# Patient Record
Sex: Female | Born: 1970 | Hispanic: Yes | Marital: Married | Smoking: Former smoker
Health system: Southern US, Community
[De-identification: ages and names within clinical notes are randomized; demographics above are authoritative.]

## PROBLEM LIST (undated history)

## (undated) DIAGNOSIS — J342 Deviated nasal septum: Principal | ICD-10-CM

## (undated) DIAGNOSIS — J343 Hypertrophy of nasal turbinates: Secondary | ICD-10-CM

## (undated) DIAGNOSIS — Z98811 Dental restoration status: Secondary | ICD-10-CM

## (undated) DIAGNOSIS — A159 Respiratory tuberculosis unspecified: Secondary | ICD-10-CM

## (undated) DIAGNOSIS — G43909 Migraine, unspecified, not intractable, without status migrainosus: Secondary | ICD-10-CM

## (undated) HISTORY — PX: TONSILLECTOMY: SUR1361

---

## 1996-05-07 HISTORY — PX: DIAGNOSTIC LAPAROSCOPY: SUR761

## 2011-12-05 ENCOUNTER — Encounter (HOSPITAL_COMMUNITY): Payer: Self-pay | Admitting: Pharmacist

## 2011-12-06 ENCOUNTER — Encounter (HOSPITAL_COMMUNITY): Payer: Self-pay

## 2011-12-06 ENCOUNTER — Encounter (HOSPITAL_COMMUNITY)
Admission: RE | Admit: 2011-12-06 | Discharge: 2011-12-06 | Disposition: A | Discharge status: Home / Self Care | Payer: BC Managed Care – PPO | Source: Ambulatory Visit | Attending: Obstetrics & Gynecology | Admitting: Obstetrics & Gynecology

## 2011-12-06 LAB — CBC
MCH: 29.6 pg (ref 26.0–34.0)
MCHC: 32.8 g/dL (ref 30.0–36.0)
MCV: 90.3 fL (ref 78.0–100.0)
Platelets: 180 10*3/uL (ref 150–400)
RDW: 12.4 % (ref 11.5–15.5)

## 2011-12-06 NOTE — Patient Instructions (Addendum)
   Your procedure is scheduled on: Thursday August 8th  Enter through the Main Entrance of [facility] at: 11:30am Pick up the phone at the desk and dial [phone] and inform us of your arrival.  Please call this number if you have any problems the morning of surgery: [phone]  Remember: Do not eat food after midnight: Wednesday Do not drink clear liquids after: Thursday at 9am Take these medicines the morning of surgery with a SIP OF WATER: none  Do not wear jewelry, make-up, or FINGER nail polish No metal in your hair or on your body. Do not wear lotions, powders, perfumes or deodorant. Do not shave 48 hours prior to surgery. Do not bring valuables to the hospital. Contacts, dentures or bridgework may not be worn into surgery.  Leave suitcase in the car. After Surgery it may be brought to your room. For patients being admitted to the hospital, checkout time is 11:00am the day of discharge.  Patients discharged on the day of surgery will not be allowed to drive home.     Remember to use your hibiclens as instructed.Please shower with 1/2 bottle the evening before your surgery and the other 1/2 bottle the morning of surgery. Neck down avoiding private area.

## 2011-12-10 ENCOUNTER — Other Ambulatory Visit: Payer: Self-pay | Admitting: Obstetrics & Gynecology

## 2011-12-12 MED ORDER — DEXTROSE 5 % IV SOLN
2.0000 g | INTRAVENOUS | Status: AC
  Administered 2011-12-13: 2 g via INTRAVENOUS
  Filled 2011-12-12: qty 2

## 2011-12-13 ENCOUNTER — Encounter (HOSPITAL_COMMUNITY)
Admission: RE | Disposition: A | Discharge status: Home / Self Care | Payer: Self-pay | Source: Ambulatory Visit | Attending: Obstetrics & Gynecology

## 2011-12-13 ENCOUNTER — Encounter (HOSPITAL_COMMUNITY): Payer: Self-pay | Admitting: Anesthesiology

## 2011-12-13 ENCOUNTER — Encounter (HOSPITAL_COMMUNITY): Payer: Self-pay | Admitting: *Deleted

## 2011-12-13 ENCOUNTER — Ambulatory Visit (HOSPITAL_COMMUNITY): Payer: BC Managed Care – PPO | Admitting: Anesthesiology

## 2011-12-13 ENCOUNTER — Ambulatory Visit (HOSPITAL_COMMUNITY)
Admission: RE | Admit: 2011-12-13 | Discharge: 2011-12-14 | Disposition: A | Discharge status: Home / Self Care | Payer: BC Managed Care – PPO | Source: Ambulatory Visit | Attending: Obstetrics & Gynecology | Admitting: Obstetrics & Gynecology

## 2011-12-13 DIAGNOSIS — N946 Dysmenorrhea, unspecified: Secondary | ICD-10-CM | POA: Insufficient documentation

## 2011-12-13 DIAGNOSIS — IMO0002 Reserved for concepts with insufficient information to code with codable children: Secondary | ICD-10-CM | POA: Insufficient documentation

## 2011-12-13 DIAGNOSIS — N803 Endometriosis of pelvic peritoneum, unspecified: Secondary | ICD-10-CM | POA: Insufficient documentation

## 2011-12-13 DIAGNOSIS — N949 Unspecified condition associated with female genital organs and menstrual cycle: Secondary | ICD-10-CM | POA: Insufficient documentation

## 2011-12-13 HISTORY — PX: OVARIAN CYST DRAINAGE: SHX325

## 2011-12-13 HISTORY — PX: ROBOTIC ASSISTED TOTAL HYSTERECTOMY: SHX6085

## 2011-12-13 HISTORY — PX: OVARY BIOPSY: SHX2143

## 2011-12-13 HISTORY — PX: BILATERAL SALPINGECTOMY: SHX5743

## 2011-12-13 LAB — TYPE AND SCREEN

## 2011-12-13 LAB — ABO/RH: ABO/RH(D): A NEG

## 2011-12-13 LAB — PREGNANCY, URINE: Preg Test, Ur: NEGATIVE

## 2011-12-13 SURGERY — ROBOTIC ASSISTED TOTAL HYSTERECTOMY
Anesthesia: General | Site: Abdomen | Wound class: Clean Contaminated

## 2011-12-13 MED ORDER — LIDOCAINE HCL (CARDIAC) 20 MG/ML IV SOLN
INTRAVENOUS | Status: DC | PRN
  Administered 2011-12-13: 60 mg via INTRAVENOUS

## 2011-12-13 MED ORDER — DULOXETINE HCL 60 MG PO CPEP
60.0000 mg | ORAL_CAPSULE | Freq: Every day | ORAL | Status: DC
Start: 2011-12-14 — End: 2011-12-14
  Administered 2011-12-14: 60 mg via ORAL
  Filled 2011-12-13: qty 1

## 2011-12-13 MED ORDER — HYDROMORPHONE HCL PF 1 MG/ML IJ SOLN
INTRAMUSCULAR | Status: AC
  Filled 2011-12-13: qty 1

## 2011-12-13 MED ORDER — HYDROMORPHONE HCL PF 1 MG/ML IJ SOLN
INTRAMUSCULAR | Status: AC
  Administered 2011-12-13: 0.5 mg via INTRAVENOUS
  Filled 2011-12-13: qty 1

## 2011-12-13 MED ORDER — HYDROMORPHONE HCL PF 1 MG/ML IJ SOLN
0.2500 mg | INTRAMUSCULAR | Status: DC | PRN
  Administered 2011-12-13 (×4): 0.5 mg via INTRAVENOUS

## 2011-12-13 MED ORDER — SCOPOLAMINE 1 MG/3DAYS TD PT72
MEDICATED_PATCH | TRANSDERMAL | Status: AC
  Administered 2011-12-13: 1.5 mg via TRANSDERMAL
  Filled 2011-12-13: qty 1

## 2011-12-13 MED ORDER — IBUPROFEN 600 MG PO TABS
600.0000 mg | ORAL_TABLET | Freq: Four times a day (QID) | ORAL | Status: DC | PRN
  Administered 2011-12-14: 600 mg via ORAL
  Filled 2011-12-13: qty 1

## 2011-12-13 MED ORDER — DEXAMETHASONE SODIUM PHOSPHATE 10 MG/ML IJ SOLN
INTRAMUSCULAR | Status: AC
  Filled 2011-12-13: qty 1

## 2011-12-13 MED ORDER — LIDOCAINE HCL (CARDIAC) 20 MG/ML IV SOLN
INTRAVENOUS | Status: AC
  Filled 2011-12-13: qty 5

## 2011-12-13 MED ORDER — METOCLOPRAMIDE HCL 5 MG/ML IJ SOLN: 10.0000 mg | Freq: Once | INTRAMUSCULAR | Status: DC | PRN

## 2011-12-13 MED ORDER — ROCURONIUM BROMIDE 100 MG/10ML IV SOLN
INTRAVENOUS | Status: DC | PRN
  Administered 2011-12-13: 10 mg via INTRAVENOUS
  Administered 2011-12-13: 5 mg via INTRAVENOUS
  Administered 2011-12-13: 10 mg via INTRAVENOUS
  Administered 2011-12-13: 45 mg via INTRAVENOUS

## 2011-12-13 MED ORDER — ROCURONIUM BROMIDE 50 MG/5ML IV SOLN
INTRAVENOUS | Status: AC
  Filled 2011-12-13: qty 2

## 2011-12-13 MED ORDER — MEPERIDINE HCL 25 MG/ML IJ SOLN: 6.2500 mg | INTRAMUSCULAR | Status: DC | PRN

## 2011-12-13 MED ORDER — ONDANSETRON HCL 4 MG/2ML IJ SOLN
INTRAMUSCULAR | Status: DC | PRN
  Administered 2011-12-13: 4 mg via INTRAVENOUS

## 2011-12-13 MED ORDER — FENTANYL CITRATE 0.05 MG/ML IJ SOLN
INTRAMUSCULAR | Status: AC
  Filled 2011-12-13: qty 2

## 2011-12-13 MED ORDER — ONDANSETRON HCL 4 MG/2ML IJ SOLN
INTRAMUSCULAR | Status: AC
  Filled 2011-12-13: qty 2

## 2011-12-13 MED ORDER — FENTANYL CITRATE 0.05 MG/ML IJ SOLN
INTRAMUSCULAR | Status: AC
  Filled 2011-12-13: qty 5

## 2011-12-13 MED ORDER — SCOPOLAMINE 1 MG/3DAYS TD PT72
1.0000 | MEDICATED_PATCH | TRANSDERMAL | Status: DC
  Administered 2011-12-13: 1.5 mg via TRANSDERMAL

## 2011-12-13 MED ORDER — HYDROMORPHONE HCL PF 1 MG/ML IJ SOLN
1.0000 mg | INTRAMUSCULAR | Status: DC | PRN
  Administered 2011-12-13 – 2011-12-14 (×3): 1 mg via INTRAVENOUS
  Filled 2011-12-13 (×3): qty 1

## 2011-12-13 MED ORDER — ACETAMINOPHEN 10 MG/ML IV SOLN
1000.0000 mg | Freq: Once | INTRAVENOUS | Status: DC
  Filled 2011-12-13: qty 100

## 2011-12-13 MED ORDER — PROPOFOL 10 MG/ML IV EMUL
INTRAVENOUS | Status: AC
  Filled 2011-12-13: qty 20

## 2011-12-13 MED ORDER — ARTIFICIAL TEARS OP OINT
TOPICAL_OINTMENT | OPHTHALMIC | Status: DC | PRN
  Administered 2011-12-13: 1 via OPHTHALMIC

## 2011-12-13 MED ORDER — BUPIVACAINE HCL (PF) 0.25 % IJ SOLN
INTRAMUSCULAR | Status: DC | PRN
  Administered 2011-12-13: 12 mL

## 2011-12-13 MED ORDER — LACTATED RINGERS IV SOLN
INTRAVENOUS | Status: DC
  Administered 2011-12-13 (×2): via INTRAVENOUS
  Administered 2011-12-13: 125 mL/h via INTRAVENOUS

## 2011-12-13 MED ORDER — ACETAMINOPHEN 10 MG/ML IV SOLN
INTRAVENOUS | Status: DC | PRN
  Administered 2011-12-13: 1000 mg via INTRAVENOUS

## 2011-12-13 MED ORDER — LACTATED RINGERS IV SOLN
INTRAVENOUS | Status: DC
  Administered 2011-12-14: 01:00:00 via INTRAVENOUS

## 2011-12-13 MED ORDER — FENTANYL CITRATE 0.05 MG/ML IJ SOLN
INTRAMUSCULAR | Status: DC | PRN
  Administered 2011-12-13: 50 ug via INTRAVENOUS
  Administered 2011-12-13: 100 ug via INTRAVENOUS
  Administered 2011-12-13 (×4): 50 ug via INTRAVENOUS

## 2011-12-13 MED ORDER — GLYCOPYRROLATE 0.2 MG/ML IJ SOLN
INTRAMUSCULAR | Status: DC | PRN
  Administered 2011-12-13: 0.2 mg via INTRAVENOUS
  Administered 2011-12-13: .9 mg via INTRAVENOUS

## 2011-12-13 MED ORDER — NEOSTIGMINE METHYLSULFATE 1 MG/ML IJ SOLN
INTRAMUSCULAR | Status: DC | PRN
  Administered 2011-12-13: 5 mg via INTRAVENOUS

## 2011-12-13 MED ORDER — ROCURONIUM BROMIDE 50 MG/5ML IV SOLN
INTRAVENOUS | Status: AC
  Filled 2011-12-13: qty 1

## 2011-12-13 MED ORDER — MIDAZOLAM HCL 2 MG/2ML IJ SOLN
INTRAMUSCULAR | Status: AC
  Filled 2011-12-13: qty 2

## 2011-12-13 MED ORDER — PROPOFOL 10 MG/ML IV EMUL
INTRAVENOUS | Status: DC | PRN
  Administered 2011-12-13: 200 mg via INTRAVENOUS

## 2011-12-13 MED ORDER — DEXAMETHASONE SODIUM PHOSPHATE 10 MG/ML IJ SOLN
INTRAMUSCULAR | Status: DC | PRN
  Administered 2011-12-13: 10 mg via INTRAVENOUS

## 2011-12-13 MED ORDER — NEOSTIGMINE METHYLSULFATE 1 MG/ML IJ SOLN
INTRAMUSCULAR | Status: AC
  Filled 2011-12-13: qty 10

## 2011-12-13 MED ORDER — MIDAZOLAM HCL 5 MG/5ML IJ SOLN
INTRAMUSCULAR | Status: DC | PRN
  Administered 2011-12-13: 2 mg via INTRAVENOUS

## 2011-12-13 MED ORDER — GLYCOPYRROLATE 0.2 MG/ML IJ SOLN
INTRAMUSCULAR | Status: AC
  Filled 2011-12-13: qty 3

## 2011-12-13 MED ORDER — HYDROMORPHONE HCL PF 1 MG/ML IJ SOLN
INTRAMUSCULAR | Status: DC | PRN
  Administered 2011-12-13: 1 mg via INTRAVENOUS

## 2011-12-13 MED ORDER — BUPIVACAINE HCL (PF) 0.25 % IJ SOLN
INTRAMUSCULAR | Status: AC
  Filled 2011-12-13: qty 30

## 2011-12-13 MED ORDER — OXYCODONE-ACETAMINOPHEN 5-325 MG PO TABS
1.0000 | ORAL_TABLET | ORAL | Status: DC | PRN
Start: 2011-12-13 — End: 2011-12-14
  Administered 2011-12-13 – 2011-12-14 (×2): 2 via ORAL
  Administered 2011-12-14: 1 via ORAL
  Filled 2011-12-13 (×2): qty 2
  Filled 2011-12-13: qty 1

## 2011-12-13 MED ORDER — LACTATED RINGERS IR SOLN
Status: DC | PRN
  Administered 2011-12-13: 3000 mL

## 2011-12-13 SURGICAL SUPPLY — 71 items
APPLICATOR CHLORAPREP 3ML ORNG (MISCELLANEOUS) ×3 IMPLANT
BAG URINE DRAINAGE (UROLOGICAL SUPPLIES) ×3 IMPLANT
BARRIER ADHS 3X4 INTERCEED (GAUZE/BANDAGES/DRESSINGS) IMPLANT
BLADE LAPAROSCOPIC MORCELL KIT (BLADE) IMPLANT
CABLE HIGH FREQUENCY MONO STRZ (ELECTRODE) ×3 IMPLANT
CATH FOLEY 3WAY  5CC 16FR (CATHETERS) ×1
CATH FOLEY 3WAY 5CC 16FR (CATHETERS) ×2 IMPLANT
CLOTH BEACON ORANGE TIMEOUT ST (SAFETY) ×3 IMPLANT
CONT PATH 16OZ SNAP LID 3702 (MISCELLANEOUS) ×3 IMPLANT
COVER MAYO STAND STRL (DRAPES) ×3 IMPLANT
COVER TABLE BACK 60X90 (DRAPES) ×6 IMPLANT
COVER TIP SHEARS 8 DVNC (MISCELLANEOUS) ×2 IMPLANT
COVER TIP SHEARS 8MM DA VINCI (MISCELLANEOUS) ×1
DECANTER SPIKE VIAL GLASS SM (MISCELLANEOUS) ×3 IMPLANT
DERMABOND ADVANCED (GAUZE/BANDAGES/DRESSINGS) ×2
DERMABOND ADVANCED .7 DNX12 (GAUZE/BANDAGES/DRESSINGS) ×4 IMPLANT
DRAPE HUG U DISPOSABLE (DRAPE) ×3 IMPLANT
DRAPE LG THREE QUARTER DISP (DRAPES) ×6 IMPLANT
DRAPE MONITOR DA VINCI (DRAPE) IMPLANT
DRAPE WARM FLUID 44X44 (DRAPE) ×3 IMPLANT
ELECT REM PT RETURN 9FT ADLT (ELECTROSURGICAL) ×3
ELECTRODE REM PT RTRN 9FT ADLT (ELECTROSURGICAL) ×2 IMPLANT
EVACUATOR SMOKE 8.L (FILTER) ×3 IMPLANT
GAUZE VASELINE 3X9 (GAUZE/BANDAGES/DRESSINGS) IMPLANT
GLOVE BIO SURGEON STRL SZ 6.5 (GLOVE) ×6 IMPLANT
GLOVE BIO SURGEON STRL SZ7 (GLOVE) ×6 IMPLANT
GLOVE BIOGEL PI IND STRL 7.0 (GLOVE) ×8 IMPLANT
GLOVE BIOGEL PI IND STRL 7.5 (GLOVE) ×6 IMPLANT
GLOVE BIOGEL PI INDICATOR 7.0 (GLOVE) ×4
GLOVE BIOGEL PI INDICATOR 7.5 (GLOVE) ×3
GLOVE SURG SS PI 7.0 STRL IVOR (GLOVE) ×9 IMPLANT
GOWN STRL REIN XL XLG (GOWN DISPOSABLE) ×24 IMPLANT
IV STOPCOCK 4 WAY 40  W/Y SET (IV SOLUTION)
IV STOPCOCK 4 WAY 40 W/Y SET (IV SOLUTION) IMPLANT
KIT ACCESSORY DA VINCI DISP (KITS) ×1
KIT ACCESSORY DVNC DISP (KITS) ×2 IMPLANT
KIT DISP ACCESSORY 4 ARM (KITS) IMPLANT
NEEDLE HYPO 22GX1.5 SAFETY (NEEDLE) IMPLANT
OCCLUDER COLPOPNEUMO (BALLOONS) ×3 IMPLANT
PACK LAVH (CUSTOM PROCEDURE TRAY) ×3 IMPLANT
PAD OB MATERNITY 4.3X12.25 (PERSONAL CARE ITEMS) ×3 IMPLANT
PAD PREP 24X48 CUFFED NSTRL (MISCELLANEOUS) ×6 IMPLANT
PLUG CATH AND CAP STER (CATHETERS) ×3 IMPLANT
PROTECTOR NERVE ULNAR (MISCELLANEOUS) ×6 IMPLANT
SET CYSTO W/LG BORE CLAMP LF (SET/KITS/TRAYS/PACK) IMPLANT
SET IRRIG TUBING LAPAROSCOPIC (IRRIGATION / IRRIGATOR) ×3 IMPLANT
SOLUTION ELECTROLUBE (MISCELLANEOUS) ×3 IMPLANT
SPONGE LAP 18X18 X RAY DECT (DISPOSABLE) IMPLANT
SUT VIC AB 0 CT1 27 (SUTURE) ×7
SUT VIC AB 0 CT1 27XBRD ANBCTR (SUTURE) ×4 IMPLANT
SUT VIC AB 0 CT1 27XBRD ANTBC (SUTURE) ×10 IMPLANT
SUT VIC AB 4-0 PS2 27 (SUTURE) ×6 IMPLANT
SUT VICRYL 0 27 CT2 27 ABS (SUTURE) IMPLANT
SUT VICRYL 0 UR6 27IN ABS (SUTURE) ×6 IMPLANT
SUT VLOC 180 0 9IN  GS21 (SUTURE) ×1
SUT VLOC 180 0 9IN GS21 (SUTURE) ×2 IMPLANT
SYR 50ML LL SCALE MARK (SYRINGE) ×3 IMPLANT
SYSTEM CONVERTIBLE TROCAR (TROCAR) ×3 IMPLANT
TIP UTERINE 5.1X6CM LAV DISP (MISCELLANEOUS) IMPLANT
TIP UTERINE 6.7X10CM GRN DISP (MISCELLANEOUS) IMPLANT
TIP UTERINE 6.7X6CM WHT DISP (MISCELLANEOUS) IMPLANT
TIP UTERINE 6.7X8CM BLUE DISP (MISCELLANEOUS) ×3 IMPLANT
TOWEL OR 17X24 6PK STRL BLUE (TOWEL DISPOSABLE) ×9 IMPLANT
TROCAR 12M 150ML BLUNT (TROCAR) IMPLANT
TROCAR DISP BLADELESS 8 DVNC (TROCAR) ×2 IMPLANT
TROCAR DISP BLADELESS 8MM (TROCAR) ×1
TROCAR XCEL 12X100 BLDLESS (ENDOMECHANICALS) ×3 IMPLANT
TROCAR XCEL NON-BLD 5MMX100MML (ENDOMECHANICALS) ×3 IMPLANT
TUBING FILTER THERMOFLATOR (ELECTROSURGICAL) ×3 IMPLANT
WARMER LAPAROSCOPE (MISCELLANEOUS) ×3 IMPLANT
WATER STERILE IRR 1000ML POUR (IV SOLUTION) ×9 IMPLANT

## 2011-12-13 NOTE — Anesthesia Procedure Notes (Signed)
Procedure Name: Intubation Date/Time: 12/13/2011 1:23 PM Performed by: Graciela Husbands Pre-anesthesia Checklist: Suction available, Timeout performed, Emergency Drugs available, Patient identified and Patient being monitored Patient Re-evaluated:Patient Re-evaluated prior to inductionOxygen Delivery Method: Circle system utilized Preoxygenation: Pre-oxygenation with 100% oxygen Intubation Type: IV induction Ventilation: Mask ventilation without difficulty Laryngoscope Size: Mac and 4 Grade View: Grade I Tube type: Oral Tube size: 7.0 mm Number of attempts: 1 Airway Equipment and Method: Stylet Placement Confirmation: ETT inserted through vocal cords under direct vision,  positive ETCO2 and breath sounds checked- equal and bilateral Secured at: 21 cm Tube secured with: Tape Dental Injury: Teeth and Oropharynx as per pre-operative assessment

## 2011-12-13 NOTE — OR Nursing (Signed)
Right Ovarian Cyst Biopsy performed by Dr. Seymour Bars in OR room 7.

## 2011-12-13 NOTE — Transfer of Care (Signed)
Immediate Anesthesia Transfer of Care Note  Patient: Stacy James  Procedure(s) Performed: Procedure(s) (LRB): ROBOTIC ASSISTED TOTAL HYSTERECTOMY (N/A) BILATERAL SALPINGECTOMY (Bilateral)  Patient Location: PACU  Anesthesia Type: General  Level of Consciousness: awake, alert  and oriented  Airway & Oxygen Therapy: Patient Spontanous Breathing and Patient connected to nasal cannula oxygen  Post-op Assessment: Report given to PACU RN and Post -op Vital signs reviewed and stable  Post vital signs: Reviewed and stable  Complications: No apparent anesthesia complications

## 2011-12-13 NOTE — Op Note (Addendum)
12/13/2011  3:31 PM  PATIENT:  Stacy James  41 y.o. female  PRE-OPERATIVE DIAGNOSIS:  Pelvic Pain, Fibroids, Endometriosis  POST-OPERATIVE DIAGNOSIS:  pelvic pain,fibroids,endometriosis  PROCEDURE:  Procedure(s): ROBOTIC ASSISTED TOTAL HYSTERECTOMY BILATERAL SALPINGECTOMY, Rt and Lt OVARIAN CYST DRAINAGE AND BIOPSY of Rt OVARIAN CYST.  CAUTHERIZATION OF ENDOMETRIOSIS.  SURGEON:  Surgeon(s): Genia Del, MD Robley Fries, MD  ASSISTANTS: Dr Shea Evans   ANESTHESIA:   general  PROCEDURE:  Under general anesthesia with endotracheal intubation the patient is in lithotomy position. She is on prepped with ChloraPrep on the abdomen and with Betadine on the suprapubic vulvar and vaginal areas. She is draped as usual. The weighted speculum is inserted in the vagina, the anterior lip of the cervix is grasped with a tenaculum, hysterometry is at 10 cm, the cervix was dilated with Hegar dilators, a #8 roomy with a medium coring are inserted easily, the other instruments are removed. We inserted Foley in the bladder. Then we go to the abdomen, Marcaine was infiltrated in the subcutaneous tissue and the supraumbilical area. We make a 1.2 cm incision with a scalpel at that level.  The aponeurosis is opened under direct vision with Mayo scissors. The peritoneum is opened bluntly with a finger. A pursestring stitch of Vicryl 0 is applied on the aponeurosis. The Roseanne Reno is inserted on under direct vision. The pneumoperitoneum is created with CO2. Inspection of the abdominopelvic cavities. No pathology is seen in the abdomen. The anterior wall is clear for the ports insertion. The uterus is increased in volume with multiple myomas. Both ovaries of present small simple-appearing cysts.  A peritoneal window probably caused by endometriosis is present in the right posterior cul-de-sac. A deep lesion of endometriosis is seen close to on the right utero-ovarian ligament.  A semicircular configuration is used  for port placement. Marcaine is infiltrated at all sites. A scalpel is used for incisions. All ports are inserted under direct vision. 2 robotic ports on the right one robotic ports on the lower left and the assistant 5 cm ports on the upper left. The robot his docked on the right side. The Prograsp is inserted in the third arm, the Endo Shears scissor in the right arm and the PK in the left arm. We then go to the consol.  We start on the left side, the left round ligament was cauterized and sectioned.  We then cauterized and section the left mesosalpinx to proceed with salpingectomy. The left utero-ovarian ligament was cauterized and section. We then descend along the left side of the uterus. The anterior peritoneum is opened starting on the left side to the midline. The bladder is descended passed the coe ring.  We proceed exactly the same way on the right side. Both ureters were identified and were normal anatomic position. The ovarian cysts were drained on both the right and left ovaries. A window was created on the right ovarian cyst and a biopsy was taken. The cyst the fluid was clear both side. A window of on peritoneum was present on the right posterior cul-de-sac. This was opened and released. The deep lesion of endometriosis was cauterized close to the right uterosacral ligament. We then opened the anterior peritoneum on the right side and descended the bladder further.  We then cauterized the the right uterine artery. We cauterized the left uterine artery and sectioned. And went back on the right side to section the right uterine artery. Hemostasis was well controlled. We then opened the vaginal vault  over the Coe ring.  We started opening anteriorly and went to the right side then posteriorly and finished on the left side. The uterus was completely freed. And were able to pass it vaginally.  Hemostasis was completed with the PK on the vaginal vault. All instruments were removed. We switched to the  cutting needle driver on the right-hand, the mega-needle driver on the left hand and the PK in the third arm. We used on V. LOC to close the vaginal vault. We started at the left angle and ran it to the right angle and came back on our steps to the midline.  Hemostasis was adequate at all levels. Irrigation and suction was done. All instruments were removed. We then went by laparoscopy using the 8 mm camera. The needle that was parked on the right abdominal wall was removed. We irrigated and suctioned again. Confirmed hemostasis once more. Removed all instruments. Evacuated the CO2. Removed all ports under direct vision. We then attached a pursestring stitch of the supraumbilical incision. Completed the hemostasis on all incisions with the electrocautery.  We used the Vicryl 40 arm in a subcuticular stitch to close all incisions. We then added Dermabond. The occluder was removed from the vagina. Hemostasis was adequate at that level as well. The patient was brought to recovery room in good and stable status. ESTIMATED BLOOD LOSS:  75 cc   Intake/Output Summary (Last 24 hours) at 12/13/11 1531 Last data filed at 12/13/11 1500  Gross per 24 hour  Intake   1000 ml  Output    275 ml  Net    725 ml     BLOOD ADMINISTERED:none   LOCAL MEDICATIONS USED:  MARCAINE     SPECIMEN:  Source of Specimen:  Uterus with cervix and tubes, Bx Rt ovarian cyst, Rt peritoneum  DISPOSITION OF SPECIMEN:  PATHOLOGY  COUNTS:  YES   PLAN OF CARE: Transfer to PACU   Genia Del MD  12/13/2011  At 3:33 pm

## 2011-12-13 NOTE — Anesthesia Postprocedure Evaluation (Signed)
  Anesthesia Post-op Note  Patient: Engineer, maintenance (IT)  Procedure(s) Performed: Procedure(s) (LRB): ROBOTIC ASSISTED TOTAL HYSTERECTOMY (N/A) BILATERAL SALPINGECTOMY (Bilateral)  Patient Location: PACU  Anesthesia Type: General  Level of Consciousness: awake, alert  and oriented  Airway and Oxygen Therapy: Patient Spontanous Breathing  Post-op Pain: mild  Post-op Assessment: Post-op Vital signs reviewed, Patient's Cardiovascular Status Stable, Respiratory Function Stable, Patent Airway, No signs of Nausea or vomiting and Pain level controlled  Post-op Vital Signs: Reviewed and stable  Complications: No apparent anesthesia complications

## 2011-12-13 NOTE — H&P (Signed)
Stacy James is an 41 y.o. female  G1P1  RP:  Pelvic pain, h/o endometriosis, uterine myomas for TLH, Rx of endometriosis [facility]  Pertinent Gynecological History: Menses: flow is moderate Contraception: IUD Blood transfusions: none Sexually transmitted diseases: none Previous GYN Procedures: LPSs for endometriosis  Last mammogram: normal  Last pap: normal  OB History: G1P1  Menstrual History:  Patient's last menstrual period was 12/11/2011.    Past Medical History  Diagnosis Date  . PONV (postoperative nausea and vomiting)   . Headache   . Anxiety     Past Surgical History  Procedure Date  . Diagnostic laparoscopy 1998    endometriosis  . Laparotomy 2000    endometriosis    History reviewed. No pertinent family history.  Social History:  reports that she has been smoking Cigarettes.  She has smoked for the past 10 years. She does not have any smokeless tobacco history on file. She reports that she drinks alcohol. She reports that she does not use illicit drugs.  Allergies: No Known Allergies  Prescriptions prior to admission  Medication Sig Dispense Refill  . DULoxetine (CYMBALTA) 60 MG capsule Take 60 mg by mouth daily.      Marland Kitchen ibuprofen (ADVIL,MOTRIN) 800 MG tablet Take 800 mg by mouth every 8 (eight) hours as needed.      . Naphazoline-Pheniramine (OPCON-A) 0.027-0.315 % SOLN Apply 1 drop to eye daily. Pt uses one drop in each eye daily.        Blood pressure 118/70, pulse 66, temperature 98.4 F (36.9 C), temperature source Oral, resp. rate 16, last menstrual period 12/11/2011.  Pelvic US  Ut myomas, volume about 300-400 cc, ovaries wnl   Results for orders placed during the hospital encounter of 12/13/11 (from the past 24 hour(s))  PREGNANCY, URINE     Status: Normal   Collection Time   12/13/11 11:44 AM      Component Value Range   Preg Test, Ur NEGATIVE  NEGATIVE  TYPE AND SCREEN     Status: Normal (Preliminary result)   Collection Time   12/13/11  11:49 AM      Component Value Range   ABO/RH(D) A NEG     Antibody Screen PENDING     Sample Expiration 12/16/2011      No results found.  Assessment/Plan: Pelvic pain, dyspareunia, dysmenorrhe with h/o endometriosis and uterine myomas for TLH, Rx of endometriosis assisted with Engineer, building services.  Nicolus Ose,MARIE-LYNE 12/13/2011, 1:04 PM

## 2011-12-13 NOTE — Anesthesia Preprocedure Evaluation (Signed)
Anesthesia Evaluation  Patient identified by MRN, date of birth, ID band Patient awake    Reviewed: Allergy & Precautions, H&P , NPO status , Patient's Chart, lab work & pertinent test results  History of Anesthesia Complications (+) PONV  Airway Mallampati: II TM Distance: >3 FB Neck ROM: Full    Dental No notable dental hx. (+) Teeth Intact   Pulmonary neg pulmonary ROS,  breath sounds clear to auscultation  Pulmonary exam normal       Cardiovascular negative cardio ROS  Rhythm:Regular Rate:Normal     Neuro/Psych  Headaches, Anxiety    GI/Hepatic negative GI ROS, Neg liver ROS,   Endo/Other  negative endocrine ROS  Renal/GU negative Renal ROS  negative genitourinary   Musculoskeletal negative musculoskeletal ROS (+)   Abdominal   Peds  Hematology negative hematology ROS (+)   Anesthesia Other Findings   Reproductive/Obstetrics negative OB ROS                           Anesthesia Physical Anesthesia Plan  ASA: II  Anesthesia Plan: General   Post-op Pain Management:    Induction: Intravenous  Airway Management Planned: Oral ETT  Additional Equipment:   Intra-op Plan:   Post-operative Plan: Extubation in OR  Informed Consent: I have reviewed the patients History and Physical, chart, labs and discussed the procedure including the risks, benefits and alternatives for the proposed anesthesia with the patient or authorized representative who has indicated his/her understanding and acceptance.   Dental advisory given  Plan Discussed with: CRNA, Anesthesiologist and Surgeon  Anesthesia Plan Comments:         Anesthesia Quick Evaluation

## 2011-12-14 LAB — CBC
HCT: 39.6 % (ref 36.0–46.0)
Hemoglobin: 13.1 g/dL (ref 12.0–15.0)
MCHC: 33.1 g/dL (ref 30.0–36.0)
RDW: 12.1 % (ref 11.5–15.5)
WBC: 10.8 10*3/uL — ABNORMAL HIGH (ref 4.0–10.5)

## 2011-12-14 MED ORDER — ONDANSETRON HCL 4 MG/2ML IJ SOLN: 8.0000 mg | Freq: Three times a day (TID) | INTRAMUSCULAR | Status: DC | PRN

## 2011-12-14 MED ORDER — OXYCODONE-ACETAMINOPHEN 7.5-325 MG PO TABS: 1.0000 | ORAL_TABLET | ORAL | Status: AC | PRN

## 2011-12-14 MED ORDER — ONDANSETRON 8 MG/NS 50 ML IVPB
8.0000 mg | Freq: Three times a day (TID) | INTRAVENOUS | Status: DC | PRN
  Administered 2011-12-14: 8 mg via INTRAVENOUS
  Filled 2011-12-14: qty 8

## 2011-12-14 NOTE — Discharge Summary (Signed)
  Physician Discharge Summary  Patient ID: Stacy James MRN: [id number] DOB/AGE: [birth year] 41 y.o.  Admit date: 12/13/2011 Discharge date: 12/14/2011  Admission Diagnoses: Pelvic Pain, Fibroids, Endometriosis  Discharge Diagnoses: Pelvic Pain, Fibroids, Endometriosis        Active Problems:  * No active hospital problems. *    Discharged Condition: good  Hospital Course:   Consults: None  Treatments: surgery: TLH, Rx of endometriosis, Ovarian cysts drainage.  Disposition: Final discharge disposition not confirmed   Medication List  As of 12/14/2011  8:35 AM   STOP taking these medications         OPCON-A 0.027-0.315 % Soln         TAKE these medications         DULoxetine 60 MG capsule   Commonly known as: CYMBALTA   Take 60 mg by mouth daily.      ibuprofen 800 MG tablet   Commonly known as: ADVIL,MOTRIN   Take 800 mg by mouth every 8 (eight) hours as needed.      oxyCODONE-acetaminophen 7.5-325 MG per tablet   Commonly known as: PERCOCET   Take 1 tablet by mouth every 4 (four) hours as needed for pain.           Follow-up Information    Follow up with Nishi Neiswonger,MARIE-LYNE, MD in 3 weeks.   Contact information:   [address] [place] [address] [phone]          SignedGenia Del, MD 12/14/2011, 8:35 AM

## 2011-12-14 NOTE — Addendum Note (Signed)
Addendum  created 12/14/11 1610 by Graciela Husbands, CRNA   Modules edited:Notes Section

## 2011-12-14 NOTE — Progress Notes (Signed)
1 Day Post-Op Procedure(s) (LRB): ROBOTIC ASSISTED TOTAL HYSTERECTOMY (N/A) BILATERAL SALPINGECTOMY (Bilateral)  Subjective: Patient reports that pain is well managed.  Tolerating normal diet as tolerated  diet without difficulty. No nausea / vomiting.  Ambulating and voiding.  Objective: BP 109/67  Pulse 81  Temp 97.6 F (36.4 C) (Axillary)  Resp 16  Ht 5\' 7"  (1.702 m)  Wt 76.658 kg (169 lb)  BMI 26.47 kg/m2  SpO2 100%  LMP 12/11/2011 Lungs: clear Heart: normal rate and rhythm Abdomen:soft and appropriately tender Extremities: Homans sign is negative, no sign of DVT Incision: healing well  Hgb Postop 13.1  Assessment: s/p Procedure(s): ROBOTIC ASSISTED TOTAL HYSTERECTOMY BILATERAL SALPINGECTOMY: progressing well  Plan: Discharge home, Surgery and findings discussed.  LOS: 1 day    Kristi Hyer,MARIE-LYNE 12/14/2011, 8:30 AM

## 2011-12-14 NOTE — Anesthesia Postprocedure Evaluation (Signed)
  Anesthesia Post-op Note  Patient: Stacy James  Procedure(s) Performed: Procedure(s) (LRB): ROBOTIC ASSISTED TOTAL HYSTERECTOMY (N/A) BILATERAL SALPINGECTOMY (Bilateral)  Patient Location: [facility]  Anesthesia Type: General  Level of Consciousness: awake, alert  and oriented  Airway and Oxygen Therapy: Patient Spontanous Breathing and Patient connected to nasal cannula oxygen  Post-op Pain: mild  Post-op Assessment: Post-op Vital signs reviewed and Patient's Cardiovascular Status Stable  Post-op Vital Signs: Reviewed and stable  Complications: No apparent anesthesia complications

## 2013-10-21 ENCOUNTER — Other Ambulatory Visit: Payer: Self-pay | Admitting: Otolaryngology

## 2013-10-21 DIAGNOSIS — R0981 Nasal congestion: Secondary | ICD-10-CM

## 2013-10-21 DIAGNOSIS — J329 Chronic sinusitis, unspecified: Secondary | ICD-10-CM

## 2013-10-26 ENCOUNTER — Ambulatory Visit
Admission: RE | Admit: 2013-10-26 | Discharge: 2013-10-26 | Disposition: A | Discharge status: Home / Self Care | Payer: No Typology Code available for payment source | Source: Ambulatory Visit | Attending: Otolaryngology | Admitting: Otolaryngology

## 2013-10-26 DIAGNOSIS — J329 Chronic sinusitis, unspecified: Secondary | ICD-10-CM

## 2013-10-26 DIAGNOSIS — R0981 Nasal congestion: Secondary | ICD-10-CM

## 2013-12-23 ENCOUNTER — Ambulatory Visit
Admission: RE | Admit: 2013-12-23 | Discharge: 2013-12-23 | Disposition: A | Discharge status: Home / Self Care | Payer: No Typology Code available for payment source | Source: Ambulatory Visit | Attending: Infectious Disease | Admitting: Infectious Disease

## 2013-12-23 ENCOUNTER — Other Ambulatory Visit: Payer: Self-pay | Admitting: Infectious Disease

## 2013-12-23 DIAGNOSIS — R7611 Nonspecific reaction to tuberculin skin test without active tuberculosis: Secondary | ICD-10-CM

## 2014-01-05 DIAGNOSIS — J342 Deviated nasal septum: Secondary | ICD-10-CM

## 2014-01-05 DIAGNOSIS — J343 Hypertrophy of nasal turbinates: Secondary | ICD-10-CM

## 2014-01-05 HISTORY — DX: Deviated nasal septum: J34.2

## 2014-01-05 HISTORY — DX: Hypertrophy of nasal turbinates: J34.3

## 2014-01-28 ENCOUNTER — Encounter (HOSPITAL_BASED_OUTPATIENT_CLINIC_OR_DEPARTMENT_OTHER): Payer: Self-pay | Admitting: *Deleted

## 2014-01-29 ENCOUNTER — Encounter (HOSPITAL_BASED_OUTPATIENT_CLINIC_OR_DEPARTMENT_OTHER): Payer: Self-pay | Admitting: *Deleted

## 2014-02-01 ENCOUNTER — Ambulatory Visit: Payer: Self-pay | Admitting: Otolaryngology

## 2014-02-01 NOTE — H&P (Signed)
  PREOPERATIVE H&P  Chief Complaint: nasal obstruction  HPI: Stacy James is a 43 y.o. female who presents for evaluation of nasal obstruction. She frequently uses decongestant spray to breath. She complains of sinus infections but CT scan demonstrated clear sinuses with bilateral concha bullosa. She has a moderate septal deviation and large turbinates. She's taken to the OR for septoplasty and TR to improve her nasal breathing.  Past Medical History  Diagnosis Date  . Migraines   . Dental crowns present   . Nasal turbinate hypertrophy 01/2014  . Deviated nasal septum 01/2014   Past Surgical History  Procedure Laterality Date  . Diagnostic laparoscopy  1998    endometriosis  . Robotic assisted total hysterectomy  12/13/2011    with cauterization of endometriosis  . Bilateral salpingectomy  12/13/2011  . Ovarian cyst drainage Bilateral 12/13/2011  . Ovary biopsy Right 12/13/2011  . Tonsillectomy     History   Social History  . Marital Status: Married    Spouse Name: N/A    Number of Children: N/A  . Years of Education: N/A   Social History Main Topics  . Smoking status: Former Smoker    Quit date: 05/06/2010  . Smokeless tobacco: Never Used  . Alcohol Use: Yes     Comment: social  . Drug Use: No  . Sexual Activity: Yes    Birth Control/ Protection: IUD   Other Topics Concern  . Not on file   Social History Narrative  . No narrative on file   No family history on file. No Known Allergies Prior to Admission medications   Not on File     Positive ROS: negative  All other systems have been reviewed and were otherwise negative with the exception of those mentioned in the HPI and as above.  Physical Exam: There were no vitals filed for this visit.  General: Alert, no acute distress Oral: Normal oral mucosa and tonsils Nasal: Septal deviation to the left with large turbinates. No polyps. Neck: No palpable adenopathy or thyroid nodules Ear: Ear canal is clear with  normal appearing TMs Cardiovascular: Regular rate and rhythm, no murmur.  Respiratory: Clear to auscultation Neurologic: Alert and oriented x 3   Assessment/Plan: Septoplasty and Turbinate Reductions Plan for  Septal deviation and nasal obstruction  Dillard Cannon, MD 02/01/2014 5:40 PM

## 2014-02-02 ENCOUNTER — Encounter (HOSPITAL_BASED_OUTPATIENT_CLINIC_OR_DEPARTMENT_OTHER)
Admission: RE | Disposition: A | Discharge status: Home / Self Care | Payer: Self-pay | Source: Ambulatory Visit | Attending: Otolaryngology

## 2014-02-02 ENCOUNTER — Encounter (HOSPITAL_BASED_OUTPATIENT_CLINIC_OR_DEPARTMENT_OTHER): Payer: Self-pay | Admitting: *Deleted

## 2014-02-02 ENCOUNTER — Ambulatory Visit (HOSPITAL_BASED_OUTPATIENT_CLINIC_OR_DEPARTMENT_OTHER)
Admission: RE | Admit: 2014-02-02 | Discharge: 2014-02-02 | Disposition: A | Discharge status: Home / Self Care | Payer: No Typology Code available for payment source | Source: Ambulatory Visit | Attending: Otolaryngology | Admitting: Otolaryngology

## 2014-02-02 ENCOUNTER — Ambulatory Visit (HOSPITAL_BASED_OUTPATIENT_CLINIC_OR_DEPARTMENT_OTHER): Payer: No Typology Code available for payment source | Admitting: Anesthesiology

## 2014-02-02 ENCOUNTER — Encounter (HOSPITAL_BASED_OUTPATIENT_CLINIC_OR_DEPARTMENT_OTHER): Payer: No Typology Code available for payment source | Admitting: Anesthesiology

## 2014-02-02 DIAGNOSIS — Z87891 Personal history of nicotine dependence: Secondary | ICD-10-CM | POA: Insufficient documentation

## 2014-02-02 DIAGNOSIS — J342 Deviated nasal septum: Secondary | ICD-10-CM | POA: Insufficient documentation

## 2014-02-02 DIAGNOSIS — J343 Hypertrophy of nasal turbinates: Secondary | ICD-10-CM | POA: Diagnosis not present

## 2014-02-02 DIAGNOSIS — G43909 Migraine, unspecified, not intractable, without status migrainosus: Secondary | ICD-10-CM | POA: Insufficient documentation

## 2014-02-02 DIAGNOSIS — J3489 Other specified disorders of nose and nasal sinuses: Secondary | ICD-10-CM | POA: Diagnosis not present

## 2014-02-02 HISTORY — PX: NASAL SEPTOPLASTY W/ TURBINOPLASTY: SHX2070

## 2014-02-02 HISTORY — DX: Migraine, unspecified, not intractable, without status migrainosus: G43.909

## 2014-02-02 HISTORY — DX: Dental restoration status: Z98.811

## 2014-02-02 HISTORY — DX: Hypertrophy of nasal turbinates: J34.3

## 2014-02-02 HISTORY — DX: Deviated nasal septum: J34.2

## 2014-02-02 LAB — POCT HEMOGLOBIN-HEMACUE: Hemoglobin: 13.6 g/dL (ref 12.0–15.0)

## 2014-02-02 SURGERY — SEPTOPLASTY, NOSE, WITH NASAL TURBINATE REDUCTION
Anesthesia: General | Site: Nose | Laterality: Bilateral

## 2014-02-02 MED ORDER — BACITRACIN ZINC 500 UNIT/GM EX OINT
TOPICAL_OINTMENT | CUTANEOUS | Status: DC | PRN
  Administered 2014-02-02: 1 via TOPICAL

## 2014-02-02 MED ORDER — SODIUM CHLORIDE 0.9 % IJ SOLN
INTRAMUSCULAR | Status: AC
  Filled 2014-02-02: qty 10

## 2014-02-02 MED ORDER — MIDAZOLAM HCL 2 MG/2ML IJ SOLN
INTRAMUSCULAR | Status: AC
  Filled 2014-02-02: qty 2

## 2014-02-02 MED ORDER — SCOPOLAMINE 1 MG/3DAYS TD PT72: 1.0000 | MEDICATED_PATCH | Freq: Once | TRANSDERMAL | Status: DC

## 2014-02-02 MED ORDER — LACTATED RINGERS IV SOLN
INTRAVENOUS | Status: DC
  Administered 2014-02-02: 07:00:00 via INTRAVENOUS

## 2014-02-02 MED ORDER — HYDROMORPHONE HCL 1 MG/ML IJ SOLN
INTRAMUSCULAR | Status: AC
  Filled 2014-02-02: qty 1

## 2014-02-02 MED ORDER — OXYCODONE HCL 5 MG/5ML PO SOLN: 5.0000 mg | Freq: Once | ORAL | Status: AC | PRN

## 2014-02-02 MED ORDER — PROPOFOL 10 MG/ML IV EMUL
INTRAVENOUS | Status: AC
  Filled 2014-02-02: qty 50

## 2014-02-02 MED ORDER — MIDAZOLAM HCL 2 MG/ML PO SYRP: 12.0000 mg | ORAL_SOLUTION | Freq: Once | ORAL | Status: DC | PRN

## 2014-02-02 MED ORDER — OXYCODONE HCL 5 MG PO TABS
5.0000 mg | ORAL_TABLET | Freq: Once | ORAL | Status: AC | PRN
  Administered 2014-02-02: 5 mg via ORAL

## 2014-02-02 MED ORDER — MIDAZOLAM HCL 2 MG/2ML IJ SOLN: 1.0000 mg | INTRAMUSCULAR | Status: DC | PRN

## 2014-02-02 MED ORDER — MIDAZOLAM HCL 5 MG/5ML IJ SOLN
INTRAMUSCULAR | Status: DC | PRN
  Administered 2014-02-02: 2 mg via INTRAVENOUS

## 2014-02-02 MED ORDER — METHYLPREDNISOLONE ACETATE 80 MG/ML IJ SUSP
INTRAMUSCULAR | Status: AC
  Filled 2014-02-02: qty 1

## 2014-02-02 MED ORDER — FENTANYL CITRATE 0.05 MG/ML IJ SOLN: 50.0000 ug | INTRAMUSCULAR | Status: DC | PRN

## 2014-02-02 MED ORDER — SUFENTANIL CITRATE 50 MCG/ML IV SOLN
INTRAVENOUS | Status: DC | PRN
  Administered 2014-02-02 (×2): 5 ug via INTRAVENOUS
  Administered 2014-02-02: 10 ug via INTRAVENOUS

## 2014-02-02 MED ORDER — PROPOFOL 10 MG/ML IV BOLUS
INTRAVENOUS | Status: DC | PRN
  Administered 2014-02-02: 200 mg via INTRAVENOUS

## 2014-02-02 MED ORDER — METHYLPREDNISOLONE ACETATE 80 MG/ML IJ SUSP
INTRAMUSCULAR | Status: DC | PRN
  Administered 2014-02-02: 80 mg

## 2014-02-02 MED ORDER — OXYMETAZOLINE HCL 0.05 % NA SOLN
NASAL | Status: DC | PRN
  Administered 2014-02-02: 1 via NASAL

## 2014-02-02 MED ORDER — CEPHALEXIN 500 MG PO CAPS: 500.0000 mg | ORAL_CAPSULE | Freq: Two times a day (BID) | ORAL | Status: DC

## 2014-02-02 MED ORDER — OXYCODONE HCL 5 MG PO TABS
ORAL_TABLET | ORAL | Status: AC
  Filled 2014-02-02: qty 1

## 2014-02-02 MED ORDER — ROPIVACAINE HCL 5 MG/ML IJ SOLN: INTRAMUSCULAR | Status: DC | PRN

## 2014-02-02 MED ORDER — HYDROCODONE-ACETAMINOPHEN 5-325 MG PO TABS: 1.0000 | ORAL_TABLET | Freq: Four times a day (QID) | ORAL | Status: DC | PRN

## 2014-02-02 MED ORDER — LIDOCAINE-EPINEPHRINE 1 %-1:100000 IJ SOLN
INTRAMUSCULAR | Status: DC | PRN
  Administered 2014-02-02: 15 mL

## 2014-02-02 MED ORDER — BACITRACIN ZINC 500 UNIT/GM EX OINT
TOPICAL_OINTMENT | CUTANEOUS | Status: AC
  Filled 2014-02-02: qty 28.35

## 2014-02-02 MED ORDER — SUCCINYLCHOLINE CHLORIDE 20 MG/ML IJ SOLN
INTRAMUSCULAR | Status: DC | PRN
  Administered 2014-02-02: 100 mg via INTRAVENOUS

## 2014-02-02 MED ORDER — SODIUM CHLORIDE 0.9 % IJ SOLN
INTRAMUSCULAR | Status: DC | PRN
  Administered 2014-02-02: 3.5 mL

## 2014-02-02 MED ORDER — HYDROMORPHONE HCL 1 MG/ML IJ SOLN
0.2500 mg | INTRAMUSCULAR | Status: DC | PRN
  Administered 2014-02-02 (×4): 0.5 mg via INTRAVENOUS

## 2014-02-02 MED ORDER — CEFAZOLIN SODIUM-DEXTROSE 2-3 GM-% IV SOLR
2.0000 g | INTRAVENOUS | Status: AC
  Administered 2014-02-02: 2 g via INTRAVENOUS

## 2014-02-02 MED ORDER — SUFENTANIL CITRATE 50 MCG/ML IV SOLN
INTRAVENOUS | Status: AC
  Filled 2014-02-02: qty 1

## 2014-02-02 MED ORDER — ONDANSETRON HCL 4 MG/2ML IJ SOLN
INTRAMUSCULAR | Status: DC | PRN
  Administered 2014-02-02: 4 mg via INTRAVENOUS

## 2014-02-02 MED ORDER — SCOPOLAMINE 1 MG/3DAYS TD PT72
MEDICATED_PATCH | TRANSDERMAL | Status: AC
  Filled 2014-02-02: qty 1

## 2014-02-02 MED ORDER — OXYMETAZOLINE HCL 0.05 % NA SOLN
NASAL | Status: AC
  Filled 2014-02-02: qty 15

## 2014-02-02 MED ORDER — CEFAZOLIN SODIUM-DEXTROSE 2-3 GM-% IV SOLR
INTRAVENOUS | Status: AC
  Filled 2014-02-02: qty 50

## 2014-02-02 MED ORDER — LIDOCAINE-EPINEPHRINE 1 %-1:100000 IJ SOLN
INTRAMUSCULAR | Status: AC
  Filled 2014-02-02: qty 1

## 2014-02-02 MED ORDER — DEXAMETHASONE SODIUM PHOSPHATE 4 MG/ML IJ SOLN
INTRAMUSCULAR | Status: DC | PRN
  Administered 2014-02-02: 10 mg via INTRAVENOUS

## 2014-02-02 SURGICAL SUPPLY — 37 items
ATTRACTOMAT 16X20 MAGNETIC DRP (DRAPES) IMPLANT
BLADE INF TURB ROT M4 2 5PK (BLADE) ×2 IMPLANT
BLADE INF TURB ROT M4 2MM 5PK (BLADE) ×1
CANISTER SUCT 1200ML W/VALVE (MISCELLANEOUS) ×3 IMPLANT
COAGULATOR SUCT 8FR VV (MISCELLANEOUS) ×3 IMPLANT
DECANTER SPIKE VIAL GLASS SM (MISCELLANEOUS) IMPLANT
DRSG NASOPORE 8CM (GAUZE/BANDAGES/DRESSINGS) IMPLANT
DRSG TELFA 3X8 NADH (GAUZE/BANDAGES/DRESSINGS) ×3 IMPLANT
ELECT REM PT RETURN 9FT ADLT (ELECTROSURGICAL) ×3
ELECTRODE REM PT RTRN 9FT ADLT (ELECTROSURGICAL) ×1 IMPLANT
GLOVE BIOGEL PI IND STRL 7.0 (GLOVE) ×2 IMPLANT
GLOVE BIOGEL PI INDICATOR 7.0 (GLOVE) ×4
GLOVE ECLIPSE 7.0 STRL STRAW (GLOVE) ×3 IMPLANT
GLOVE SS BIOGEL STRL SZ 7.5 (GLOVE) ×1 IMPLANT
GLOVE SUPERSENSE BIOGEL SZ 7.5 (GLOVE) ×2
GOWN STRL REUS W/ TWL LRG LVL3 (GOWN DISPOSABLE) ×2 IMPLANT
GOWN STRL REUS W/TWL LRG LVL3 (GOWN DISPOSABLE) ×4
IV NS 500ML (IV SOLUTION) ×2
IV NS 500ML BAXH (IV SOLUTION) ×1 IMPLANT
NEEDLE 27GAX1X1/2 (NEEDLE) ×3 IMPLANT
NS IRRIG 1000ML POUR BTL (IV SOLUTION) IMPLANT
PACK BASIN DAY SURGERY FS (CUSTOM PROCEDURE TRAY) ×3 IMPLANT
PACK ENT DAY SURGERY (CUSTOM PROCEDURE TRAY) ×3 IMPLANT
PATTIES SURGICAL .5 X3 (DISPOSABLE) ×3 IMPLANT
SHEET SILASTIC 8X6X.030 25-30 (MISCELLANEOUS) IMPLANT
SLEEVE SCD COMPRESS KNEE MED (MISCELLANEOUS) ×3 IMPLANT
SPONGE GAUZE 2X2 8PLY STER LF (GAUZE/BANDAGES/DRESSINGS) ×1
SPONGE GAUZE 2X2 8PLY STRL LF (GAUZE/BANDAGES/DRESSINGS) ×2 IMPLANT
SUT CHROMIC 4 0 PS 2 18 (SUTURE) ×3 IMPLANT
SUT ETHILON 3 0 PS 1 (SUTURE) IMPLANT
SUT SILK 2 0 FS (SUTURE) ×3 IMPLANT
SUT VIC AB 4-0 P-3 18XBRD (SUTURE) IMPLANT
SUT VIC AB 4-0 P3 18 (SUTURE)
SYR 3ML 18GX1 1/2 (SYRINGE) ×3 IMPLANT
TOWEL OR 17X24 6PK STRL BLUE (TOWEL DISPOSABLE) ×6 IMPLANT
TRAY DSU PREP LF (CUSTOM PROCEDURE TRAY) ×3 IMPLANT
YANKAUER SUCT BULB TIP NO VENT (SUCTIONS) ×3 IMPLANT

## 2014-02-02 NOTE — Anesthesia Postprocedure Evaluation (Signed)
  Anesthesia Post-op Note  Patient: Stacy James  Procedure(s) Performed: Procedure(s): NASAL SEPTOPLASTY WITH TURBINATE REDUCTION (Bilateral)  Patient Location: PACU  Anesthesia Type:General  Level of Consciousness: awake and alert   Airway and Oxygen Therapy: Patient Spontanous Breathing  Post-op Pain: moderate  Post-op Assessment: Post-op Vital signs reviewed, Patient's Cardiovascular Status Stable and Respiratory Function Stable  Post-op Vital Signs: Reviewed  Filed Vitals:   02/02/14 1115  BP: 136/61  Pulse: 81  Temp:   Resp: 15    Complications: No apparent anesthesia complications

## 2014-02-02 NOTE — Anesthesia Procedure Notes (Addendum)
Procedures

## 2014-02-02 NOTE — Anesthesia Preprocedure Evaluation (Signed)
Anesthesia Evaluation  Patient identified by MRN, date of birth, ID band Patient awake    Reviewed: Allergy & Precautions, H&P , NPO status , Patient's Chart, lab work & pertinent test results  Airway Mallampati: I TM Distance: >3 FB Neck ROM: Full    Dental no notable dental hx. (+) Teeth Intact, Dental Advisory Given   Pulmonary neg pulmonary ROS, former smoker,  breath sounds clear to auscultation  Pulmonary exam normal       Cardiovascular negative cardio ROS  Rhythm:Regular Rate:Normal     Neuro/Psych  Headaches, negative neurological ROS  negative psych ROS   GI/Hepatic negative GI ROS, Neg liver ROS,   Endo/Other  negative endocrine ROS  Renal/GU negative Renal ROS  negative genitourinary   Musculoskeletal   Abdominal   Peds  Hematology negative hematology ROS (+)   Anesthesia Other Findings   Reproductive/Obstetrics negative OB ROS                           Anesthesia Physical Anesthesia Plan  ASA: I  Anesthesia Plan: General   Post-op Pain Management:    Induction: Intravenous  Airway Management Planned: Oral ETT  Additional Equipment:   Intra-op Plan:   Post-operative Plan: Extubation in OR  Informed Consent: I have reviewed the patients History and Physical, chart, labs and discussed the procedure including the risks, benefits and alternatives for the proposed anesthesia with the patient or authorized representative who has indicated his/her understanding and acceptance.   Dental advisory given  Plan Discussed with: CRNA  Anesthesia Plan Comments:         Anesthesia Quick Evaluation

## 2014-02-02 NOTE — H&P (View-Only) (Signed)
  PREOPERATIVE H&P  Chief Complaint: nasal obstruction  HPI: Stacy James is a 43 y.o. female who presents for evaluation of nasal obstruction. She frequently uses decongestant spray to breath. She complains of sinus infections but CT scan demonstrated clear sinuses with bilateral concha bullosa. She has a moderate septal deviation and large turbinates. She's taken to the OR for septoplasty and TR to improve her nasal breathing.  Past Medical History  Diagnosis Date  . Migraines   . Dental crowns present   . Nasal turbinate hypertrophy 01/2014  . Deviated nasal septum 01/2014   Past Surgical History  Procedure Laterality Date  . Diagnostic laparoscopy  1998    endometriosis  . Robotic assisted total hysterectomy  12/13/2011    with cauterization of endometriosis  . Bilateral salpingectomy  12/13/2011  . Ovarian cyst drainage Bilateral 12/13/2011  . Ovary biopsy Right 12/13/2011  . Tonsillectomy     History   Social History  . Marital Status: Married    Spouse Name: N/A    Number of Children: N/A  . Years of Education: N/A   Social History Main Topics  . Smoking status: Former Smoker    Quit date: 05/06/2010  . Smokeless tobacco: Never Used  . Alcohol Use: Yes     Comment: social  . Drug Use: No  . Sexual Activity: Yes    Birth Control/ Protection: IUD   Other Topics Concern  . Not on file   Social History Narrative  . No narrative on file   No family history on file. No Known Allergies Prior to Admission medications   Not on File     Positive ROS: negative  All other systems have been reviewed and were otherwise negative with the exception of those mentioned in the HPI and as above.  Physical Exam: There were no vitals filed for this visit.  General: Alert, no acute distress Oral: Normal oral mucosa and tonsils Nasal: Septal deviation to the left with large turbinates. No polyps. Neck: No palpable adenopathy or thyroid nodules Ear: Ear canal is clear with  normal appearing TMs Cardiovascular: Regular rate and rhythm, no murmur.  Respiratory: Clear to auscultation Neurologic: Alert and oriented x 3   Assessment/Plan: Septoplasty and Turbinate Reductions Plan for  Septal deviation and nasal obstruction  Dillard CannonNEWMAN, CHRISTOPHER, MD 02/01/2014 5:40 PM

## 2014-02-02 NOTE — Interval H&P Note (Signed)
History and Physical Interval Note:  02/02/2014 7:39 AM  Stacy James  has presented today for surgery, with the diagnosis of TURBINATE HYPERTROPHY DEVIATED SEPTUM  The various methods of treatment have been discussed with the patient and family. After consideration of risks, benefits and other options for treatment, the patient has consented to  Procedure(s): NASAL SEPTOPLASTY WITH TURBINATE REDUCTION (N/A) as a surgical intervention .  The patient's history has been reviewed, patient examined, no change in status, stable for surgery.  I have reviewed the patient's chart and labs.  Questions were answered to the patient's satisfaction.     Daneille Desilva

## 2014-02-02 NOTE — Discharge Instructions (Addendum)
Elevate head of bed and apply cool compress to nose to reduce swelling and bleeding Take your regular meds Tylenol , motrin or hydrocodone 1-2 prn pain Take Keflex 500 mg twice per day starting tonight Return to Dr Allene PyoNewman's office tomorrow at 11:30 to have your nasal packing removed Call office if you have any questions  [phone]   Post Anesthesia Home Care Instructions  Activity: Get plenty of rest for the remainder of the day. A responsible adult should stay with you for 24 hours following the procedure.  For the next 24 hours, DO NOT: -Drive a car -Advertising copywriterperate machinery -Drink alcoholic beverages -Take any medication unless instructed by your physician -Make any legal decisions or sign important papers.  Meals: Start with liquid foods such as gelatin or soup. Progress to regular foods as tolerated. Avoid greasy, spicy, heavy foods. If nausea and/or vomiting occur, drink only clear liquids until the nausea and/or vomiting subsides. Call your physician if vomiting continues.  Special Instructions/Symptoms: Your throat may feel dry or sore from the anesthesia or the breathing tube placed in your throat during surgery. If this causes discomfort, gargle with warm salt water. The discomfort should disappear within 24 hours.

## 2014-02-02 NOTE — Brief Op Note (Signed)
02/02/2014  9:42 AM  PATIENT:  Stacy James  43 y.o. female  PRE-OPERATIVE DIAGNOSIS:  TURBINATE HYPERTROPHY DEVIATED SEPTUM  POST-OPERATIVE DIAGNOSIS:  TURBINATE HYPERTROPHY DEVIATED SEPTUM  PROCEDURE:  Procedure(s): NASAL SEPTOPLASTY WITH TURBINATE REDUCTION (Bilateral)  SURGEON:  Surgeon(s) and Role:    * Drema Halonhristopher E Araseli Sherry, MD - Primary  PHYSICIAN ASSISTANT:   ASSISTANTS: none   ANESTHESIA:   general  EBL:     BLOOD ADMINISTERED:none  DRAINS: none   LOCAL MEDICATIONS USED:  LIDOCAINE with EPI   14cc  SPECIMEN:  No Specimen  DISPOSITION OF SPECIMEN:  N/A  COUNTS:  YES  TOURNIQUET:  * No tourniquets in log *  DICTATION: .Other Dictation: Dictation Number [id number]  PLAN OF CARE: Discharge to home after PACU  PATIENT DISPOSITION:  PACU - hemodynamically stable.   Delay start of Pharmacological VTE agent (>24hrs) due to surgical blood loss or risk of bleeding: yes

## 2014-02-02 NOTE — Transfer of Care (Signed)
Immediate Anesthesia Transfer of Care Note  Patient: Stacy SarinClaudia James  Procedure(s) Performed: Procedure(s): NASAL SEPTOPLASTY WITH TURBINATE REDUCTION (Bilateral)  Patient Location: PACU  Anesthesia Type:General  Level of Consciousness: awake, alert  and oriented  Airway & Oxygen Therapy: Patient Spontanous Breathing and Patient connected to face mask oxygen  Post-op Assessment: Report given to PACU RN and Post -op Vital signs reviewed and stable  Post vital signs: Reviewed and stable  Complications: No apparent anesthesia complications

## 2014-02-03 ENCOUNTER — Encounter (HOSPITAL_BASED_OUTPATIENT_CLINIC_OR_DEPARTMENT_OTHER): Payer: Self-pay | Admitting: Otolaryngology

## 2014-02-04 NOTE — Op Note (Signed)
[name], 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  OPERATION PERFORMED:  Septoplasty with bilateral inferior turbinate reductions with Medtronic turbinate blade.  Reduction of bilateral concha bullosa.  SURGEON:  Kristine GarbeChristopher E. Ezzard StandingNewman, MD  ANESTHESIA:  General endotracheal.  COMPLICATIONS:  None.  BRIEF CLINICAL NOTE:  Stacy James is a 43 year old female, who has had a long history of nasal congestion and sinus problems.  She has tried nasal steroid sprays as well as antihistamines.  She then using a decongestant spray fairly frequently because of trouble breathing through her nose unless she uses a decongestant spray.  She describes some pressure between her eyes and had sinus infections in the past. However, on review of her recent CT scan of her sinuses, she had really clear paranasal sinuses with bilateral middle turbinate concha bullosa. Because of her chronic trouble breathing, large turbinates, and concha bullosa, as well as a moderate septal deflection to the right, she was taken to the operating room at this time for septoplasty and turbinate reductions as well as reduction of concha bullosa.  Of note, she had previous rhinoplasty as a younger adult.  DESCRIPTION OF PROCEDURE:  After adequate endotracheal anesthesia, the patient's nose was prepped with Betadine solution and draped out with sterile towels.  She received 2 g Ancef IV preoperatively.   On examination of the nose, she had a moderate deflection of the anterior cartilaginous septum to the right specially superiorly and more post on the right side a large septal spur posteriorly on the right side.  She had large middle turbinates as well as large inferior turbinates. First, the hemitransfixion incision was made along the caudal edge of the septum on the right side.  Mucoperichondrial and mucoperiosteal flaps were elevated along the right side of the septum.  Just anterior to the vomer, a vertical incision was made through the cartilaginous septum about 2-2.5 cm posteriorly from the anterior portion of the septum and some of the cartilaginous bony septum that protruded to the right was removed.  In addition, elevation was carried out along the either side of the septal spur posteriorly on the nose and the septal spur was removed.  This completed the septoplasty portion of the procedure.  Following this, the medial aspect of the concha bullosa was removed on both the middle turbinates.  A vertical incision was made through the middle of the middle turbinate on either side and the medial portion of the concha bullosa was removed on both sides.  Hemostasis was obtained with suction cautery of the posterior aspect of the concha bullosa in posterior inferior turbinate and middle turbinate.  Following this, using the Medtronic turbinate blade, submucosal turbinate reductions  were performed of the inferior turbinates on both sides.  The inferior turbinate bone was then outfractured.  This completed the procedure.  The hemitransfixion incision was closed with interrupted 4-0 chromic sutures.  The septum was  pasted with a 4-0 chromic suture.  In addition, a small amount of submucosal tissue and subcutaneous fat was removed between the inferior portion of the medial aspect of the medial crura to narrow up the base of the nose.  A single stitch was placed to reapproximate the  inferior aspects of the medial crura on both sides. The nose was then packed with Telfa soaked in bacitracin ointment.  The patient was awoke from anesthesia and transferred to recovery room postop doing well.  DISPOSITION:  She is discharged home later this morning on Keflex 500 mg b.i.d. for 1 week, Tylenol and Vicodin p.r.n. pain, and will follow up in my office tomorrow to have nasal packs removed.          ______________________________ Kristine Garbe Ezzard Standing, M.D.     CEN/MEDQ  D:  02/02/2014  T:  02/02/2014  Job:  [id number]

## 2014-06-14 ENCOUNTER — Encounter (HOSPITAL_COMMUNITY): Payer: Self-pay | Admitting: Emergency Medicine

## 2014-06-14 ENCOUNTER — Emergency Department (INDEPENDENT_AMBULATORY_CARE_PROVIDER_SITE_OTHER)
Admission: EM | Admit: 2014-06-14 | Discharge: 2014-06-14 | Disposition: A | Discharge status: Home / Self Care | Payer: No Typology Code available for payment source | Source: Home / Self Care | Attending: Family Medicine | Admitting: Family Medicine

## 2014-06-14 DIAGNOSIS — G43009 Migraine without aura, not intractable, without status migrainosus: Secondary | ICD-10-CM

## 2014-06-14 DIAGNOSIS — K2970 Gastritis, unspecified, without bleeding: Secondary | ICD-10-CM

## 2014-06-14 DIAGNOSIS — R112 Nausea with vomiting, unspecified: Secondary | ICD-10-CM

## 2014-06-14 HISTORY — DX: Respiratory tuberculosis unspecified: A15.9

## 2014-06-14 LAB — CBC WITH DIFFERENTIAL/PLATELET
BASOS ABS: 0 10*3/uL (ref 0.0–0.1)
Basophils Relative: 0 % (ref 0–1)
EOS PCT: 0 % (ref 0–5)
Eosinophils Absolute: 0 10*3/uL (ref 0.0–0.7)
HEMATOCRIT: 43.1 % (ref 36.0–46.0)
HEMOGLOBIN: 14.7 g/dL (ref 12.0–15.0)
LYMPHS PCT: 17 % (ref 12–46)
Lymphs Abs: 1 10*3/uL (ref 0.7–4.0)
MCH: 30.1 pg (ref 26.0–34.0)
MCHC: 34.1 g/dL (ref 30.0–36.0)
MCV: 88.3 fL (ref 78.0–100.0)
MONO ABS: 0.2 10*3/uL (ref 0.1–1.0)
Monocytes Relative: 4 % (ref 3–12)
NEUTROS PCT: 79 % — AB (ref 43–77)
Neutro Abs: 4.6 10*3/uL (ref 1.7–7.7)
PLATELETS: 162 10*3/uL (ref 150–400)
RBC: 4.88 MIL/uL (ref 3.87–5.11)
RDW: 12.1 % (ref 11.5–15.5)
WBC: 5.9 10*3/uL (ref 4.0–10.5)

## 2014-06-14 LAB — COMPREHENSIVE METABOLIC PANEL
ALBUMIN: 3.9 g/dL (ref 3.5–5.2)
ALT: 67 U/L — ABNORMAL HIGH (ref 0–35)
AST: 37 U/L (ref 0–37)
Alkaline Phosphatase: 36 U/L — ABNORMAL LOW (ref 39–117)
Anion gap: 6 (ref 5–15)
BUN: 17 mg/dL (ref 6–23)
CO2: 29 mmol/L (ref 19–32)
CREATININE: 0.72 mg/dL (ref 0.50–1.10)
Calcium: 8.8 mg/dL (ref 8.4–10.5)
Chloride: 104 mmol/L (ref 96–112)
GFR calc Af Amer: >90 mL/min (ref >90)
GFR calc non Af Amer: >90 mL/min (ref >90)
Glucose, Bld: 91 mg/dL (ref 70–99)
Potassium: 3.6 mmol/L (ref 3.5–5.1)
Sodium: 139 mmol/L (ref 135–145)
Total Bilirubin: 0.8 mg/dL (ref 0.3–1.2)
Total Protein: 6.8 g/dL (ref 6.0–8.3)

## 2014-06-14 LAB — LIPASE, BLOOD: Lipase: 34 U/L (ref 11–59)

## 2014-06-14 MED ORDER — METOCLOPRAMIDE HCL 5 MG/ML IJ SOLN
INTRAMUSCULAR | Status: AC
  Filled 2014-06-14: qty 2

## 2014-06-14 MED ORDER — DIPHENHYDRAMINE HCL 50 MG/ML IJ SOLN
INTRAMUSCULAR | Status: AC
  Filled 2014-06-14: qty 1

## 2014-06-14 MED ORDER — SODIUM CHLORIDE 0.9 % IV BOLUS (SEPSIS)
1000.0000 mL | Freq: Once | INTRAVENOUS | Status: AC
  Administered 2014-06-14: 1000 mL via INTRAVENOUS

## 2014-06-14 MED ORDER — METOCLOPRAMIDE HCL 5 MG/ML IJ SOLN
10.0000 mg | Freq: Once | INTRAMUSCULAR | Status: AC
  Administered 2014-06-14: 10 mg via INTRAVENOUS

## 2014-06-14 MED ORDER — DIPHENHYDRAMINE HCL 50 MG/ML IJ SOLN
12.5000 mg | Freq: Once | INTRAMUSCULAR | Status: AC
  Administered 2014-06-14: 12.5 mg via INTRAVENOUS

## 2014-06-14 MED ORDER — KETOROLAC TROMETHAMINE 30 MG/ML IJ SOLN
30.0000 mg | Freq: Once | INTRAMUSCULAR | Status: AC
  Administered 2014-06-14: 30 mg via INTRAVENOUS

## 2014-06-14 MED ORDER — BUTALBITAL-APAP-CAFFEINE 50-325-40 MG PO TABS: 1.0000 | ORAL_TABLET | Freq: Four times a day (QID) | ORAL | Status: AC | PRN

## 2014-06-14 MED ORDER — KETOROLAC TROMETHAMINE 30 MG/ML IJ SOLN
INTRAMUSCULAR | Status: AC
  Filled 2014-06-14: qty 1

## 2014-06-14 MED ORDER — PROMETHAZINE HCL 25 MG PO TABS: 25.0000 mg | ORAL_TABLET | Freq: Four times a day (QID) | ORAL | Status: AC | PRN

## 2014-06-14 NOTE — Discharge Instructions (Signed)
Migraine Headache  A migraine headache is an intense, throbbing pain on one or both sides of your head. A migraine can last for 30 minutes to several hours.  CAUSES   The exact cause of a migraine headache is not always known. However, a migraine may be caused when nerves in the brain become irritated and release chemicals that cause inflammation. This causes pain.  Certain things may also trigger migraines, such as:   Alcohol.   Smoking.   Stress.   Menstruation.   Aged cheeses.   Foods or drinks that contain nitrates, glutamate, aspartame, or tyramine.   Lack of sleep.   Chocolate.   Caffeine.   Hunger.   Physical exertion.   Fatigue.   Medicines used to treat chest pain (nitroglycerine), birth control pills, estrogen, and some blood pressure medicines.  SIGNS AND SYMPTOMS   Pain on one or both sides of your head.   Pulsating or throbbing pain.   Severe pain that prevents daily activities.   Pain that is aggravated by any physical activity.   Nausea, vomiting, or both.   Dizziness.   Pain with exposure to bright lights, loud noises, or activity.   General sensitivity to bright lights, loud noises, or smells.  Before you get a migraine, you may get warning signs that a migraine is coming (aura). An aura may include:   Seeing flashing lights.   Seeing bright spots, halos, or zigzag lines.   Having tunnel vision or blurred vision.   Having feelings of numbness or tingling.   Having trouble talking.   Having muscle weakness.  DIAGNOSIS   A migraine headache is often diagnosed based on:   Symptoms.   Physical exam.   A CT scan or MRI of your head. These imaging tests cannot diagnose migraines, but they can help rule out other causes of headaches.  TREATMENT  Medicines may be given for pain and nausea. Medicines can also be given to help prevent recurrent migraines.   HOME CARE INSTRUCTIONS   Only take over-the-counter or prescription medicines for pain or discomfort as directed by your  health care provider. The use of long-term narcotics is not recommended.   Lie down in a dark, quiet room when you have a migraine.   Keep a journal to find out what may trigger your migraine headaches. For example, write down:   What you eat and drink.   How much sleep you get.   Any change to your diet or medicines.   Limit alcohol consumption.   Quit smoking if you smoke.   Get 7-9 hours of sleep, or as recommended by your health care provider.   Limit stress.   Keep lights dim if bright lights bother you and make your migraines worse.  SEEK IMMEDIATE MEDICAL CARE IF:    Your migraine becomes severe.   You have a fever.   You have a stiff neck.   You have vision loss.   You have muscular weakness or loss of muscle control.   You start losing your balance or have trouble walking.   You feel faint or pass out.   You have severe symptoms that are different from your first symptoms.  MAKE SURE YOU:    Understand these instructions.   Will watch your condition.   Will get help right away if you are not doing well or get worse.  Document Released: 04/23/2005 Document Revised: 09/07/2013 Document Reviewed: 12/29/2012  ExitCare Patient Information 2015 [employer]. This information   is not intended to replace advice given to you by your health care provider. Make sure you discuss any questions you have with your health care provider.  Nausea and Vomiting  Nausea is a sick feeling that often comes before throwing up (vomiting). Vomiting is a reflex where stomach contents come out of your mouth. Vomiting can cause severe loss of body fluids (dehydration). Children and elderly adults can become dehydrated quickly, especially if they also have diarrhea. Nausea and vomiting are symptoms of a condition or disease. It is important to find the cause of your symptoms.  CAUSES    Direct irritation of the stomach lining. This irritation can result from increased acid production (gastroesophageal reflux  disease), infection, food poisoning, taking certain medicines (such as nonsteroidal anti-inflammatory drugs), alcohol use, or tobacco use.   Signals from the brain.These signals could be caused by a headache, heat exposure, an inner ear disturbance, increased pressure in the brain from injury, infection, a tumor, or a concussion, pain, emotional stimulus, or metabolic problems.   An obstruction in the gastrointestinal tract (bowel obstruction).   Illnesses such as diabetes, hepatitis, gallbladder problems, appendicitis, kidney problems, cancer, sepsis, atypical symptoms of a heart attack, or eating disorders.   Medical treatments such as chemotherapy and radiation.   Receiving medicine that makes you sleep (general anesthetic) during surgery.  DIAGNOSIS  Your caregiver may ask for tests to be done if the problems do not improve after a few days. Tests may also be done if symptoms are severe or if the reason for the nausea and vomiting is not clear. Tests may include:   Urine tests.   Blood tests.   Stool tests.   Cultures (to look for evidence of infection).   X-rays or other imaging studies.  Test results can help your caregiver make decisions about treatment or the need for additional tests.  TREATMENT  You need to stay well hydrated. Drink frequently but in small amounts.You may wish to drink water, sports drinks, clear broth, or eat frozen ice pops or gelatin dessert to help stay hydrated.When you eat, eating slowly may help prevent nausea.There are also some antinausea medicines that may help prevent nausea.  HOME CARE INSTRUCTIONS    Take all medicine as directed by your caregiver.   If you do not have an appetite, do not force yourself to eat. However, you must continue to drink fluids.   If you have an appetite, eat a normal diet unless your caregiver tells you differently.   Eat a variety of complex carbohydrates (rice, wheat, potatoes, bread), lean meats, yogurt, fruits, and  vegetables.   Avoid high-fat foods because they are more difficult to digest.   Drink enough water and fluids to keep your urine clear or pale yellow.   If you are dehydrated, ask your caregiver for specific rehydration instructions. Signs of dehydration may include:   Severe thirst.   Dry lips and mouth.   Dizziness.   Dark urine.   Decreasing urine frequency and amount.   Confusion.   Rapid breathing or pulse.  SEEK IMMEDIATE MEDICAL CARE IF:    You have blood or brown flecks (like coffee grounds) in your vomit.   You have black or bloody stools.   You have a severe headache or stiff neck.   You are confused.   You have severe abdominal pain.   You have chest pain or trouble breathing.   You do not urinate at least once every 8 hours.     You develop cold or clammy skin.   You continue to vomit for longer than 24 to 48 hours.   You have a fever.  MAKE SURE YOU:    Understand these instructions.   Will watch your condition.   Will get help right away if you are not doing well or get worse.  Document Released: 04/23/2005 Document Revised: 07/16/2011 Document Reviewed: 09/20/2010  Exit[employer] Patient Information 2015 [employer]. This information is not intended to replace advice given to you by your health care provider. Make sure you discuss any questions you have with your health care provider.

## 2014-06-14 NOTE — ED Provider Notes (Signed)
CSN: [id number]     Arrival date & time 06/14/14  [id number] History   First MD Initiated Contact with Patient 06/14/14 1044     Chief Complaint  Patient presents with  . Headache  . Emesis   (Consider location/radiation/quality/duration/timing/severity/associated sxs/prior Treatment) HPI       44 year old female resents for evaluation of headache and vomiting. This started yesterday. She went on to eat and soon after she started vomiting. She had multiple episodes of vomiting yesterday and today this morning. She has a history of migraines and she believes this has triggered a migraine. She has severe pain in the right side of her head with photophobia. This is exactly the same as previous migraines. 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 No known sick contacts. No weakness or dizziness.  Past Medical History  Diagnosis Date  . Migraines   . Dental crowns present   . Nasal turbinate hypertrophy 01/2014  . Deviated nasal septum 01/2014  . Tuberculosis    Past Surgical History  Procedure Laterality Date  . Diagnostic laparoscopy  1998    endometriosis  . Robotic assisted total hysterectomy  12/13/2011    with cauterization of endometriosis  . Bilateral salpingectomy  12/13/2011  . Ovarian cyst drainage Bilateral 12/13/2011  . Ovary biopsy Right 12/13/2011  . Tonsillectomy    . Nasal septoplasty w/ turbinoplasty Bilateral 02/02/2014    Procedure: NASAL SEPTOPLASTY WITH TURBINATE REDUCTION;  Surgeon: Drema Halonhristopher E Newman, MD;  Location: [facility];  Service: ENT;  Laterality: Bilateral;   No family history on file. History  Substance Use Topics  . Smoking status: Former Smoker    Quit date: 05/06/2010  . Smokeless tobacco: Never Used  . Alcohol Use: Yes     Comment: social   OB History    No data available     Review of Systems  Constitutional: Positive for fatigue. Negative for fever and chills.   Gastrointestinal: Positive for nausea, vomiting and abdominal pain. Negative for diarrhea.  Neurological: Positive for headaches.  All other systems reviewed and are negative.   Allergies  Review of patient's allergies indicates no known allergies.  Home Medications   Prior to Admission medications   Medication Sig Start Date End Date Taking? Authorizing Provider  RIFAMPIN PO Take 600 mg by mouth once.   Yes Historical Provider, MD  butalbital-acetaminophen-caffeine (FIORICET) 50-325-40 MG per tablet Take 1-2 tablets by mouth every 6 (six) hours as needed for headache. 06/14/14 06/14/15  Adrian BlackwaterZachary H Lannah Koike, PA-C  cephALEXin (KEFLEX) 500 MG capsule Take 1 capsule (500 mg total) by mouth 2 (two) times daily. 02/02/14   Drema Halonhristopher E Newman, MD  HYDROcodone-acetaminophen (NORCO/VICODIN) 5-325 MG per tablet Take 1-2 tablets by mouth every 6 (six) hours as needed for moderate pain. 02/02/14   Drema Halonhristopher E Newman, MD  promethazine (PHENERGAN) 25 MG tablet Take 1 tablet (25 mg total) by mouth every 6 (six) hours as needed for nausea or vomiting. 06/14/14   Adrian BlackwaterZachary H Chenille Toor, PA-C   BP 101/69 mmHg  Pulse 94  Temp(Src) 99 F (37.2 C) (Oral)  Resp 12  SpO2 99%  LMP 11/22/2011 Physical Exam  Constitutional: She is oriented to person, place, and time. Vital signs are normal. She appears well-developed and well-nourished. No distress.  HENT:  Head: Normocephalic and atraumatic.  Eyes: Conjunctivae and EOM are normal. Pupils are equal, round, and reactive to light. Right eye exhibits no discharge.  Left eye exhibits no discharge.  Neck: Normal range of motion. Neck supple.  Cardiovascular: Normal rate, regular rhythm and normal heart sounds.   Pulmonary/Chest: Effort normal and breath sounds normal. No respiratory distress.  Abdominal: There is tenderness (epigastrum and LUQ).  Neurological: She is alert and oriented to person, place, and time. She has normal strength and normal reflexes. No cranial nerve  deficit or sensory deficit. She exhibits normal muscle tone. She displays a negative Romberg sign. Coordination and gait normal.  Skin: Skin is warm and dry. No rash noted. She is not diaphoretic.  Psychiatric: She has a normal mood and affect. Judgment normal.  Nursing note and vitals reviewed.   ED Course  Procedures (including critical care time) Labs Review Labs Reviewed  CBC WITH DIFFERENTIAL/PLATELET - Abnormal; Notable for the following:    Neutrophils Relative % 79 (*)    All other components within normal limits  COMPREHENSIVE METABOLIC PANEL - Abnormal; Notable for the following:    ALT 67 (*)    Alkaline Phosphatase 36 (*)    All other components within normal limits  LIPASE, BLOOD    Imaging Review No results found.  She was given 1 L of normal saline, 30 mg IV Toradol, 10 mg IV Reglan, 25 mg IV diphenhydramine. MDM   1. Migraine without aura and without status migrainosus, not intractable   2. Non-intractable vomiting with nausea, vomiting of unspecified type   3. Gastritis    Patient has improved with treatment, headache is now 3-4 out of 10 in severity. Her labs have revealed a very slight elevation in her liver enzymes, she will take this information back to her treating physician. I do not feel this is elevated enough to warrant stopping the rifampin irritation most likely has a viral gastritis/enteritis.treat with Phenergan, also Fioricet for the headache. Follow-up when necessary   Meds ordered this encounter  Medications  . sodium chloride 0.9 % bolus 1,000 mL    Sig:   . ketorolac (TORADOL) 30 MG/ML injection 30 mg    Sig:   . metoCLOPramide (REGLAN) injection 10 mg    Sig:   . diphenhydrAMINE (BENADRYL) injection 12.5 mg    Sig:   . RIFAMPIN PO    Sig: Take 600 mg by mouth once.  . promethazine (PHENERGAN) 25 MG tablet    Sig: Take 1 tablet (25 mg total) by mouth every 6 (six) hours as needed for nausea or vomiting.    Dispense:  12 tablet     Refill:  0  . butalbital-acetaminophen-caffeine (FIORICET) 50-325-40 MG per tablet    Sig: Take 1-2 tablets by mouth every 6 (six) hours as needed for headache.    Dispense:  20 tablet    Refill:  0       Graylon Good, PA-C 06/14/14 1241

## 2014-06-14 NOTE — ED Notes (Signed)
Reports vomiting and severe headache, onset last night

## 2014-08-24 ENCOUNTER — Other Ambulatory Visit (HOSPITAL_COMMUNITY): Payer: Self-pay | Admitting: Physical Medicine and Rehabilitation

## 2014-08-24 DIAGNOSIS — M5442 Lumbago with sciatica, left side: Secondary | ICD-10-CM

## 2014-08-30 ENCOUNTER — Ambulatory Visit (HOSPITAL_COMMUNITY): Payer: No Typology Code available for payment source

## 2014-09-06 ENCOUNTER — Ambulatory Visit (HOSPITAL_COMMUNITY): Payer: No Typology Code available for payment source

## 2014-09-10 ENCOUNTER — Ambulatory Visit (HOSPITAL_COMMUNITY): Admission: RE | Admit: 2014-09-10 | Payer: No Typology Code available for payment source | Source: Ambulatory Visit

## 2014-09-14 ENCOUNTER — Ambulatory Visit (HOSPITAL_COMMUNITY): Payer: No Typology Code available for payment source

## 2014-11-23 ENCOUNTER — Emergency Department (HOSPITAL_COMMUNITY): Payer: No Typology Code available for payment source

## 2014-11-23 ENCOUNTER — Other Ambulatory Visit (HOSPITAL_COMMUNITY): Payer: Self-pay | Admitting: Family Medicine

## 2014-11-23 ENCOUNTER — Emergency Department (HOSPITAL_COMMUNITY)
Admission: EM | Admit: 2014-11-23 | Discharge: 2014-11-23 | Disposition: A | Discharge status: Home / Self Care | Payer: No Typology Code available for payment source | Attending: Emergency Medicine | Admitting: Emergency Medicine

## 2014-11-23 ENCOUNTER — Other Ambulatory Visit: Payer: Self-pay | Admitting: Family Medicine

## 2014-11-23 ENCOUNTER — Ambulatory Visit (HOSPITAL_COMMUNITY)
Admission: RE | Admit: 2014-11-23 | Discharge: 2014-11-23 | Disposition: A | Discharge status: Home / Self Care | Payer: No Typology Code available for payment source | Source: Ambulatory Visit | Attending: Physical Medicine and Rehabilitation | Admitting: Physical Medicine and Rehabilitation

## 2014-11-23 ENCOUNTER — Other Ambulatory Visit (HOSPITAL_COMMUNITY): Payer: Self-pay | Admitting: Physical Medicine and Rehabilitation

## 2014-11-23 ENCOUNTER — Encounter (HOSPITAL_COMMUNITY): Payer: Self-pay | Admitting: Emergency Medicine

## 2014-11-23 DIAGNOSIS — Z79899 Other long term (current) drug therapy: Secondary | ICD-10-CM | POA: Diagnosis not present

## 2014-11-23 DIAGNOSIS — R519 Headache, unspecified: Secondary | ICD-10-CM

## 2014-11-23 DIAGNOSIS — R51 Headache: Secondary | ICD-10-CM | POA: Diagnosis present

## 2014-11-23 DIAGNOSIS — Z87891 Personal history of nicotine dependence: Secondary | ICD-10-CM | POA: Diagnosis not present

## 2014-11-23 DIAGNOSIS — Z8611 Personal history of tuberculosis: Secondary | ICD-10-CM | POA: Diagnosis not present

## 2014-11-23 DIAGNOSIS — R42 Dizziness and giddiness: Secondary | ICD-10-CM | POA: Diagnosis not present

## 2014-11-23 DIAGNOSIS — M542 Cervicalgia: Secondary | ICD-10-CM | POA: Insufficient documentation

## 2014-11-23 DIAGNOSIS — I6200 Nontraumatic subdural hemorrhage, unspecified: Secondary | ICD-10-CM

## 2014-11-23 LAB — I-STAT CHEM 8, ED
BUN: 14 mg/dL (ref 6–20)
CHLORIDE: 107 mmol/L (ref 101–111)
Calcium, Ion: 1.19 mmol/L (ref 1.12–1.23)
Creatinine, Ser: 0.6 mg/dL (ref 0.44–1.00)
GLUCOSE: 87 mg/dL (ref 65–99)
HCT: 38 % (ref 36.0–46.0)
Hemoglobin: 12.9 g/dL (ref 12.0–15.0)
Potassium: 4.1 mmol/L (ref 3.5–5.1)
SODIUM: 141 mmol/L (ref 135–145)
TCO2: 21 mmol/L (ref 0–100)

## 2014-11-23 MED ORDER — MORPHINE SULFATE 4 MG/ML IJ SOLN
4.0000 mg | Freq: Once | INTRAMUSCULAR | Status: AC
  Administered 2014-11-23: 4 mg via INTRAVENOUS
  Filled 2014-11-23: qty 1

## 2014-11-23 MED ORDER — METOCLOPRAMIDE HCL 5 MG/ML IJ SOLN
10.0000 mg | Freq: Once | INTRAMUSCULAR | Status: AC
  Administered 2014-11-23: 10 mg via INTRAVENOUS
  Filled 2014-11-23: qty 2

## 2014-11-23 MED ORDER — SODIUM CHLORIDE 0.9 % IV BOLUS (SEPSIS)
1000.0000 mL | Freq: Once | INTRAVENOUS | Status: AC
  Administered 2014-11-23: 1000 mL via INTRAVENOUS

## 2014-11-23 MED ORDER — IOHEXOL 350 MG/ML SOLN
100.0000 mL | Freq: Once | INTRAVENOUS | Status: AC | PRN
  Administered 2014-11-23: 100 mL via INTRAVENOUS

## 2014-11-23 NOTE — ED Notes (Signed)
Bed: [id number] Expected date:  Expected time:  Means of arrival:  Comments: Hold for triage 7

## 2014-11-23 NOTE — ED Notes (Signed)
Pt sent by Dr Parke SimmersBland (her PCP) to be evaluated  for acute headache and pressure on top of head. Pt also c/o pain in back of neck and dizziness and nausea. Pt reports acute pain last night while doing intense cardio exercise. Tx with Excedrin last night, and Motrin 800. Reports slight decrease of pain. Pt is ambulatory, alert, oriented. Full ROM all extremeties with c/o dizziness

## 2014-11-23 NOTE — ED Provider Notes (Signed)
CSN: [id number]     Arrival date & time 11/23/14  1221 History   First MD Initiated Contact with Patient 11/23/14 1252     Chief Complaint  Patient presents with  . Headache    pt c/o severe headache since last night  . Dizziness  . Neck Pain     (Consider location/radiation/quality/duration/timing/severity/associated sxs/prior Treatment) HPI Comments: 44 year-old female with history of migraines, past smoker presents with acute onset headache/pressure sensation since yesterday evening. This occurred while exercising. Patient has had time with no symptoms however the pressure is intermittent. Patient has mild upper neck discomfort. No lateral neck pain, no focal weakness or numbness laterally. No vomiting. Upper neck pain worse with range of motion. No fevers or chills. No neck stiffness. These headaches are different than previous migraines. Patient does not recall a specific injury during exercise, patient has been increasing her exercise routine the past 3-4 weeks. Patient has been doing pushups squats and different other exercises.  Patient is a 44 y.o. female presenting with headaches, dizziness, and neck pain. The history is provided by the patient.  Headache Associated symptoms: dizziness and neck pain   Associated symptoms: no abdominal pain, no back pain, no congestion, no fever, no neck stiffness, no numbness, no vomiting and no weakness   Dizziness Associated symptoms: headaches   Associated symptoms: no chest pain, no shortness of breath, no vomiting and no weakness   Neck Pain Associated symptoms: headaches   Associated symptoms: no chest pain, no fever, no numbness and no weakness     Past Medical History  Diagnosis Date  . Migraines   . Dental crowns present   . Nasal turbinate hypertrophy 01/2014  . Deviated nasal septum 01/2014  . Tuberculosis    Past Surgical History  Procedure Laterality Date  . Diagnostic laparoscopy  1998    endometriosis  . Robotic assisted  total hysterectomy  12/13/2011    with cauterization of endometriosis  . Bilateral salpingectomy  12/13/2011  . Ovarian cyst drainage Bilateral 12/13/2011  . Ovary biopsy Right 12/13/2011  . Tonsillectomy    . Nasal septoplasty w/ turbinoplasty Bilateral 02/02/2014    Procedure: NASAL SEPTOPLASTY WITH TURBINATE REDUCTION;  Surgeon: Drema Halon, MD;  Location: [facility];  Service: ENT;  Laterality: Bilateral;   Family History  Problem Relation Age of Onset  . Hypertension Mother   . Hypertension Father    History  Substance Use Topics  . Smoking status: Former Smoker    Quit date: 05/06/2010  . Smokeless tobacco: Never Used  . Alcohol Use: Yes     Comment: social   OB History    No data available     Review of Systems  Constitutional: Negative for fever and chills.  HENT: Negative for congestion.   Eyes: Negative for visual disturbance.  Respiratory: Negative for shortness of breath.   Cardiovascular: Negative for chest pain.  Gastrointestinal: Negative for vomiting and abdominal pain.  Genitourinary: Negative for dysuria and flank pain.  Musculoskeletal: Positive for neck pain. Negative for back pain and neck stiffness.  Skin: Negative for rash.  Neurological: Positive for dizziness and headaches. Negative for weakness, light-headedness and numbness.      Allergies  Review of patient's allergies indicates no known allergies.  Home Medications   Prior to Admission medications   Medication Sig Start Date End Date Taking? Authorizing Provider  aspirin-acetaminophen-caffeine (EXCEDRIN MIGRAINE) [phone] MG per tablet Take 2 tablets by mouth every 6 (six) hours as  needed for headache or migraine.   Yes Historical Provider, MD  Biotin 1000 MCG tablet Take 3,000 mcg by mouth daily.   Yes Historical Provider, MD  Cholecalciferol (VITAMIN D-3) 1000 UNITS CAPS Take 1,000 Units by mouth daily.   Yes Historical Provider, MD  fluticasone (FLONASE) 50 MCG/ACT  nasal spray Place 2 sprays into both nostrils daily as needed for allergies or rhinitis.   Yes Historical Provider, MD  ibuprofen (ADVIL,MOTRIN) 800 MG tablet Take 800 mg by mouth every 8 (eight) hours as needed for headache, mild pain or moderate pain.   Yes Historical Provider, MD  butalbital-acetaminophen-caffeine (FIORICET) 50-325-40 MG per tablet Take 1-2 tablets by mouth every 6 (six) hours as needed for headache. Patient not taking: Reported on 11/23/2014 06/14/14 06/14/15  Graylon GoodZachary H Baker, PA-C  promethazine (PHENERGAN) 25 MG tablet Take 1 tablet (25 mg total) by mouth every 6 (six) hours as needed for nausea or vomiting. Patient not taking: Reported on 11/23/2014 06/14/14   Graylon GoodZachary H Baker, PA-C   BP 118/65 mmHg  Pulse 63  Temp(Src) 98.3 F (36.8 C) (Oral)  Resp 16  SpO2 100%  LMP 11/22/2011 Physical Exam  Constitutional: She is oriented to person, place, and time. She appears well-developed and well-nourished.  HENT:  Head: Normocephalic and atraumatic.  No meningismus, mild tenderness upper cervical base of the skull, no midline tenderness.  Eyes: Conjunctivae are normal. Right eye exhibits no discharge. Left eye exhibits no discharge.  Neck: Normal range of motion. Neck supple. No tracheal deviation present.  Cardiovascular: Normal rate and regular rhythm.   Pulmonary/Chest: Effort normal and breath sounds normal.  Abdominal: Soft. She exhibits no distension. There is no tenderness. There is no guarding.  Musculoskeletal: She exhibits no edema.  Neurological: She is alert and oriented to person, place, and time. GCS eye subscore is 4. GCS verbal subscore is 5. GCS motor subscore is 6.  5+ strength in UE and LE with f/e at major joints. Sensation to palpation intact in UE and LE. CNs 2-12 grossly intact.  EOMFI.  PERRL.   Finger nose and coordination intact bilateral.   Visual fields intact to finger testing.   Skin: Skin is warm. No rash noted.  Psychiatric: She has a normal mood  and affect.  Nursing note and vitals reviewed.   ED Course  Procedures (including critical care time) Labs Review Labs Reviewed  I-STAT CHEM 8, ED    Imaging Review Ct Head Wo Contrast  11/23/2014   CLINICAL DATA:  Frontal headache, nausea, dizziness since 8 p.m. yesterday.  EXAM: CT HEAD WITHOUT CONTRAST  TECHNIQUE: Contiguous axial images were obtained from the base of the skull through the vertex without intravenous contrast.  COMPARISON:  None.  FINDINGS: Ventricles are normal in size and configuration. All areas of the brain demonstrate normal gray-white matter attenuation. There is no mass, hemorrhage, edema, or other evidence of acute parenchymal abnormality. No extra-axial hemorrhage. No osseous abnormality. Visualized upper paranasal sinuses are clear.  IMPRESSION: Normal head CT.   Electronically Signed   By: Bary RichardStan  Maynard M.D.   On: 11/23/2014 14:05     EKG Interpretation None      MDM   Final diagnoses:  None   Patient presents with pressure headache since yesterday evening that started acute onset during exercise. No history or family history of aneurysms. Patient improved after medicines in the ER. Normal neuro exam. CT scan results reviewed unremarkable. Discussed risk and benefits of CT angiogram versus lumbar puncture.  Plan to pursue CT angiography to look for signs of aneurysm.  Patient's pain overall controlled in the ER. Patient's care signed to follow-up CT angio results for final disposition.  Filed Vitals:   11/23/14 1455  BP: 118/65  Pulse: 63  Temp: 98.3 F (36.8 C)  Resp: 16      Blane Ohara, MD 11/23/14 1553

## 2014-11-30 ENCOUNTER — Ambulatory Visit (HOSPITAL_COMMUNITY): Admission: RE | Admit: 2014-11-30 | Payer: No Typology Code available for payment source | Source: Ambulatory Visit

## 2014-12-01 ENCOUNTER — Other Ambulatory Visit: Payer: No Typology Code available for payment source

## 2014-12-06 ENCOUNTER — Ambulatory Visit (HOSPITAL_COMMUNITY): Payer: No Typology Code available for payment source

## 2014-12-06 ENCOUNTER — Ambulatory Visit (HOSPITAL_COMMUNITY)
Admission: RE | Admit: 2014-12-06 | Discharge: 2014-12-06 | Disposition: A | Discharge status: Home / Self Care | Payer: No Typology Code available for payment source | Source: Ambulatory Visit | Attending: Physical Medicine and Rehabilitation | Admitting: Physical Medicine and Rehabilitation

## 2014-12-06 DIAGNOSIS — M5126 Other intervertebral disc displacement, lumbar region: Secondary | ICD-10-CM | POA: Diagnosis not present

## 2014-12-06 DIAGNOSIS — M5442 Lumbago with sciatica, left side: Secondary | ICD-10-CM

## 2014-12-06 DIAGNOSIS — M545 Low back pain: Secondary | ICD-10-CM | POA: Diagnosis present

## 2016-09-22 IMAGING — CT CT ANGIO HEAD
3 of 8 series · 17 of 47 positions shown · IV contrast (OMNIPAQUE 300)
Comparison: Noncontrast head CT 1808 hr today.

CLINICAL DATA: 44-year-old female with headache and pain at the top
of the head. Pain in the back of the neck with dizziness and nausea.
Initial encounter.

EXAM:
CT ANGIOGRAPHY HEAD
TECHNIQUE: Multidetector CT imaging of the head was performed using the
standard protocol during bolus administration of intravenous
contrast. Multiplanar CT image reconstructions and MIPs were
obtained to evaluate the vascular anatomy.
CONTRAST:  100mL OMNIPAQUE IOHEXOL 350 MG/ML SOLN

[Series 6: axial · axial · 0.31mm/px · z∈[+184,+320]mm · 11 of 159 slices shown]
[im 11/159  brain]
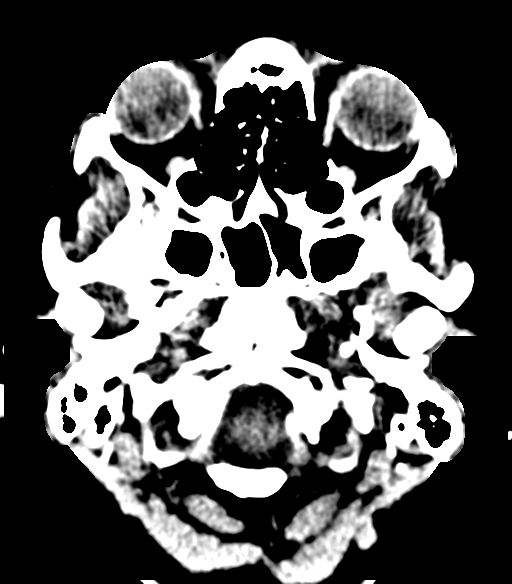
[im 22/159  bone]
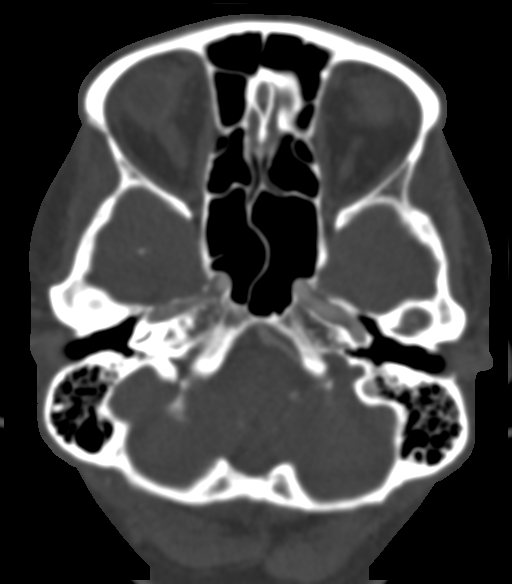
[im 43/159  brain]
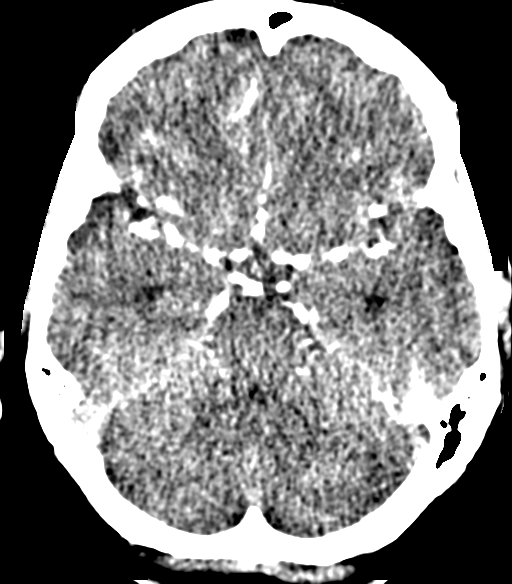
[im 53/159  bone]
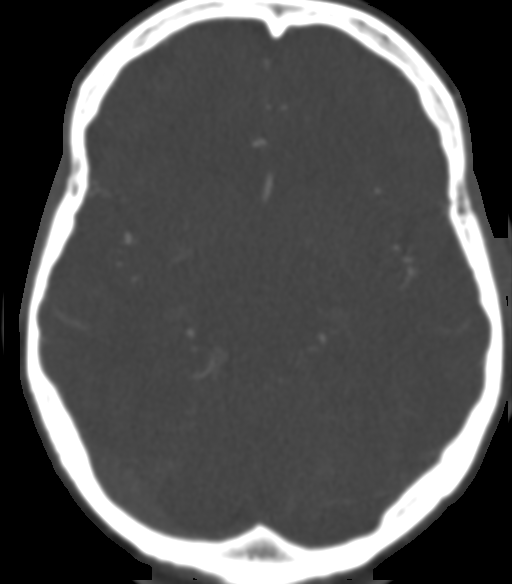
[im 64/159  brain]
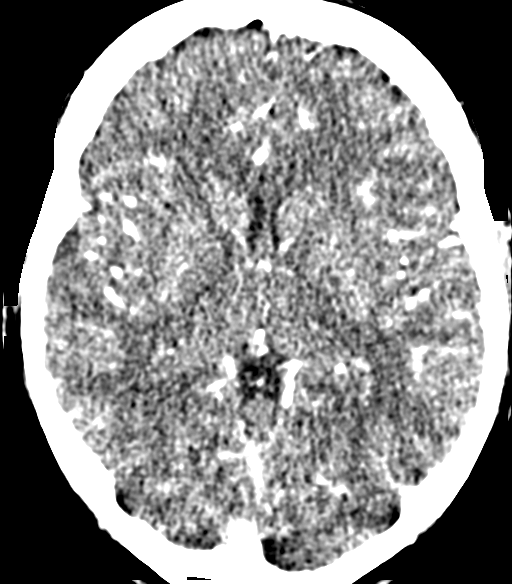
[im 85/159  bone]
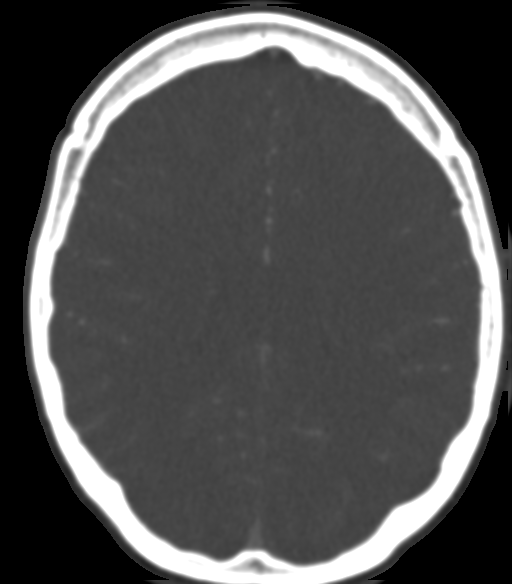
[im 95/159  brain]
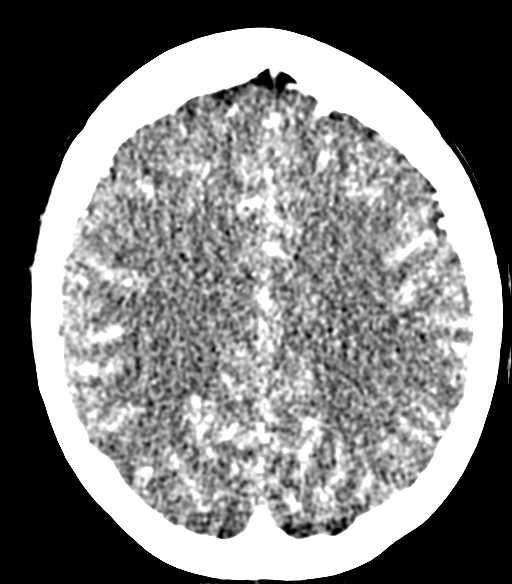
[im 106/159  bone]
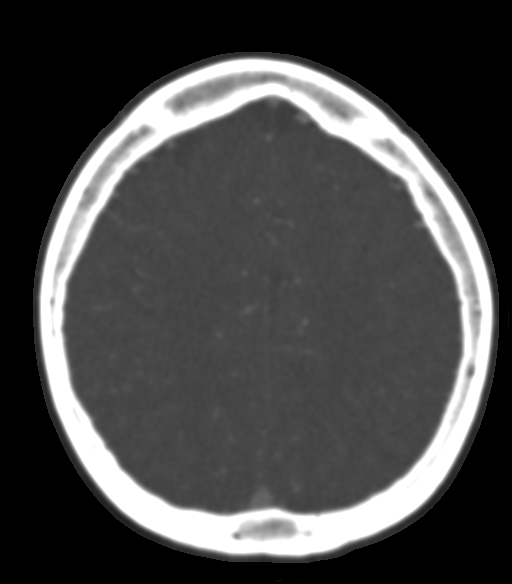
[im 116/159  brain]
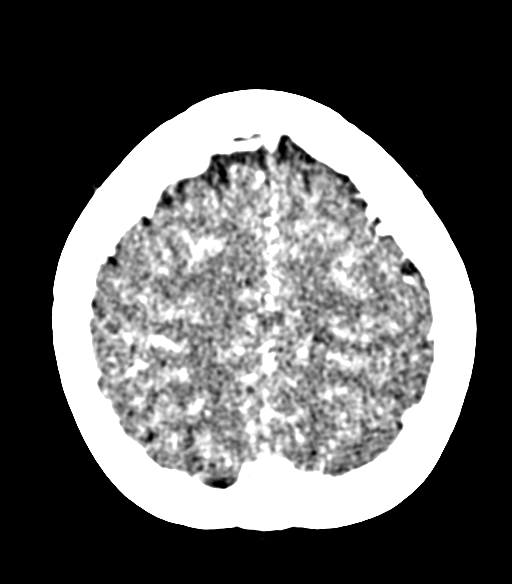
[im 137/159  bone]
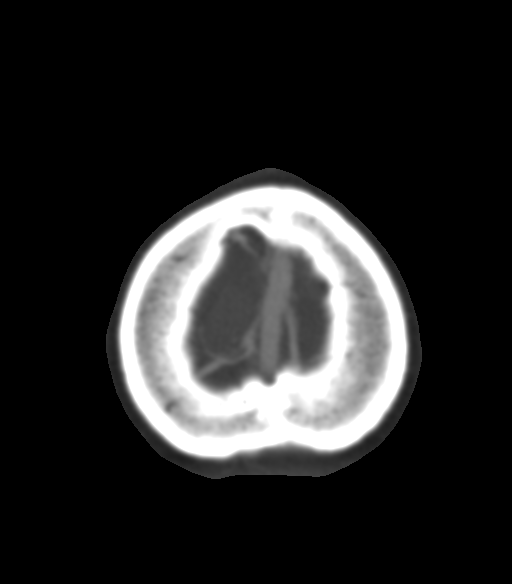
[im 148/159  brain]
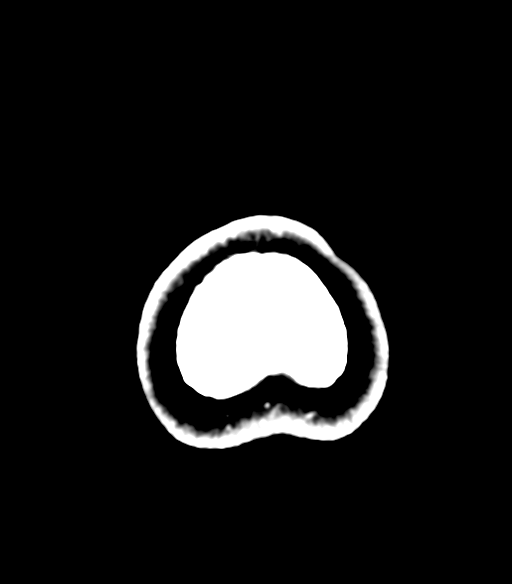

[Series 7: coronal · coronal · 0.31mm/px · 3 of 188 slices shown]
[im 54/188  brain]
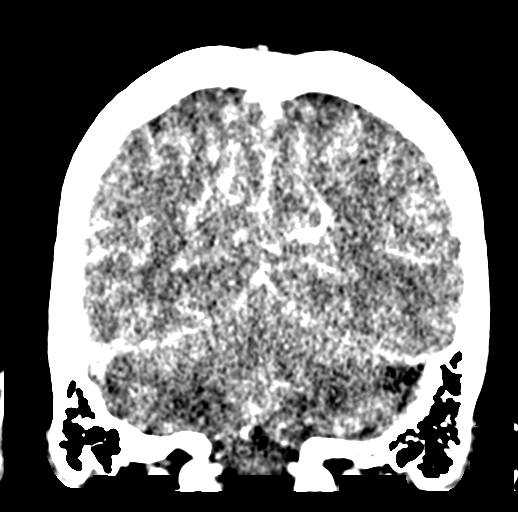
[im 81/188  brain]
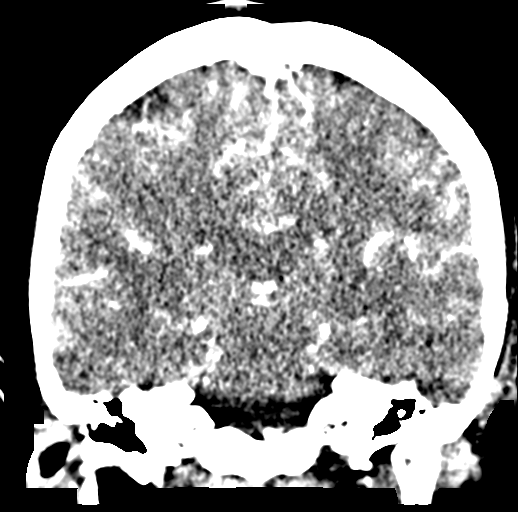
[im 107/188  brain]
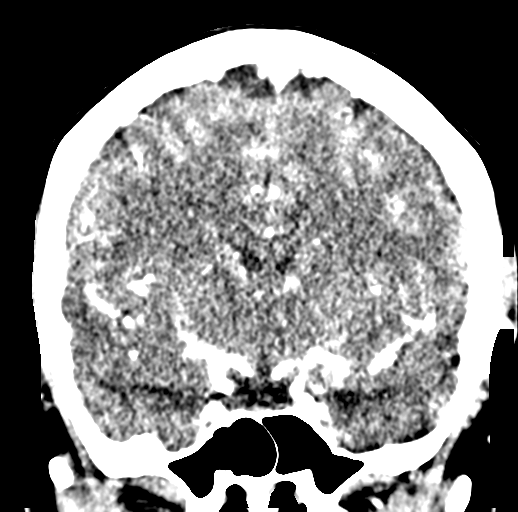

[Series 8: sagittal · sagittal · 0.28mm/px · 3 of 160 slices shown]
[im 32/160  brain]
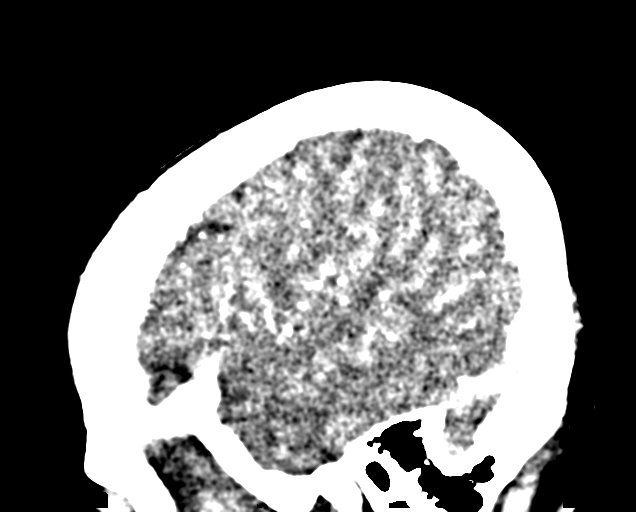
[im 64/160  brain]
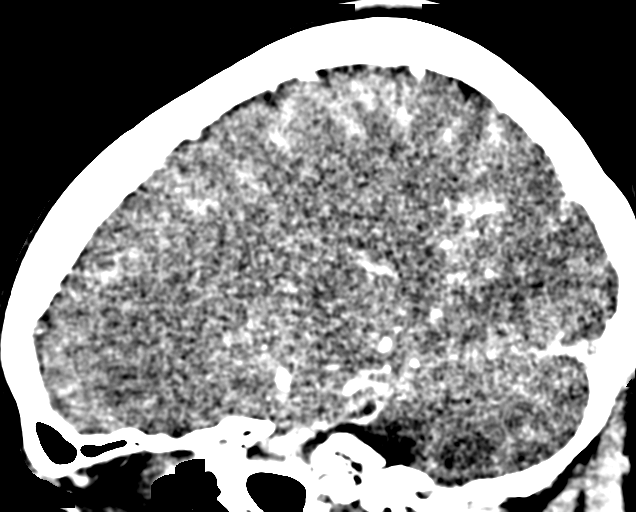
[im 96/160  brain]
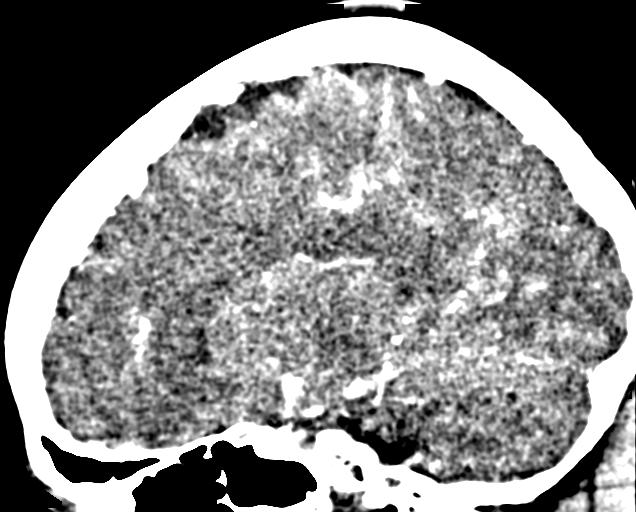

[17 of 47 positions shown; findings below may reference images not displayed]

FINDINGS: Posterior circulation: Codominant distal vertebral arteries are
patent. No distal vertebral artery stenosis identified. Both PICA
origins appear patent. Mildly tortuous but otherwise negative
vertebrobasilar junction. No basilar artery stenosis. SCA and PCA
origins are normal. Left posterior communicating artery appears
normal, the right is diminutive or absent. Bilateral PCA branches
are within normal limits.

Anterior circulation: Distal cervical ICAs are patent. Both ICA
siphons are patent without atherosclerosis or stenosis identified.
Ophthalmic artery and left posterior communicating artery 0 origins
are within normal limits. Patent carotid termini.

Bilateral MCA and ACA origins appear normal. Anterior communicating
artery is diminutive or absent. Bilateral ACA branches are within
normal limits. Left MCA M1 segment, bifurcation, and left MCA
branches are within normal limits. Right MCA M1 segment,
bifurcation, and right MCA branches are within normal limits.

Venous sinuses: Patent.  The right transverse sinus is dominant.

Anatomic variants: None.

Delayed phase:No abnormal enhancement identified. Stable and
negative gray-white matter differentiation compared to earlier
today.
IMPRESSION: Negative intracranial CTA.

## 2016-09-22 IMAGING — CT CT HEAD W/O CM
2 series · 16 of 30 positions shown, 20 images · non-contrast
Comparison: None.

CLINICAL DATA: Frontal headache, nausea, dizziness since 8 p.m.
yesterday.

EXAM:
CT HEAD WITHOUT CONTRAST
TECHNIQUE: Contiguous axial images were obtained from the base of the skull
through the vertex without intravenous contrast.

[Series 2: head w/o · axial · non-contrast · 0.45mm/px · z∈[+204,+330]mm · 13 of 31 slices shown, 17 images]
[im 3/31  brain]
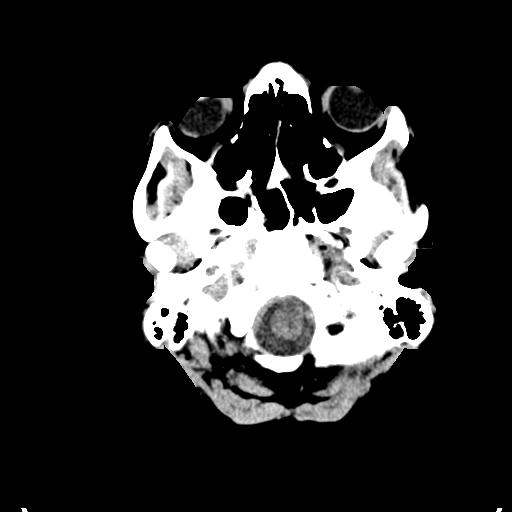
[im 3/31  bone]
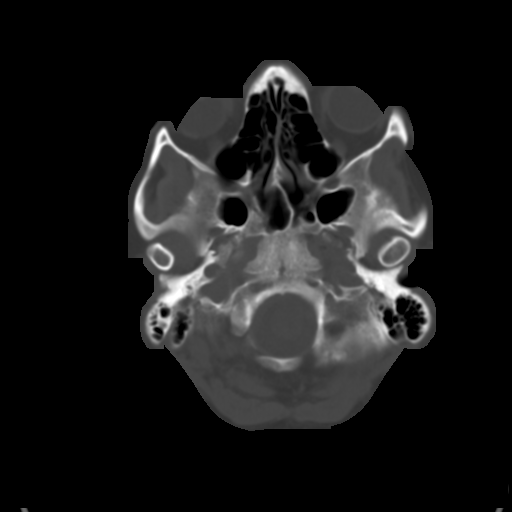
[im 5/31  brain]
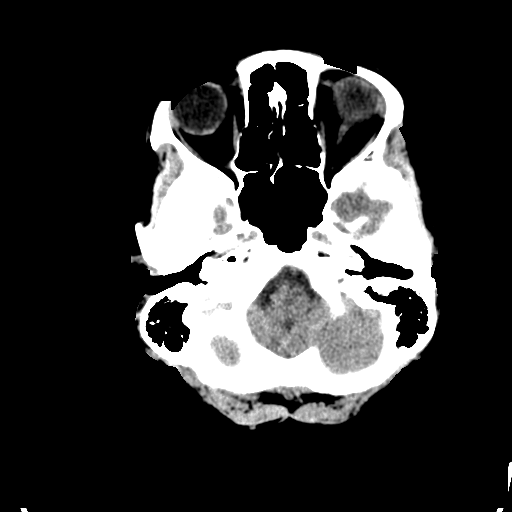
[im 7/31  brain]
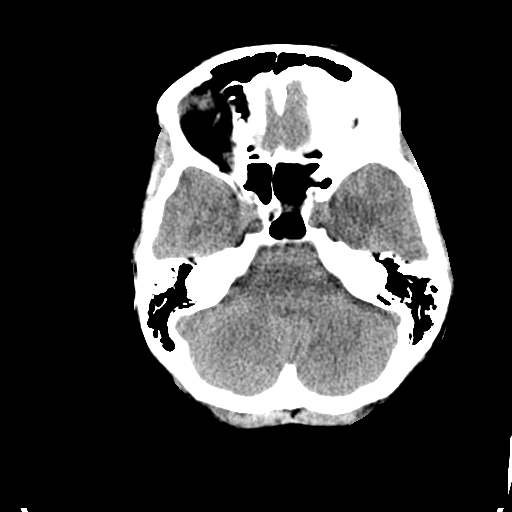
[im 9/31  brain]
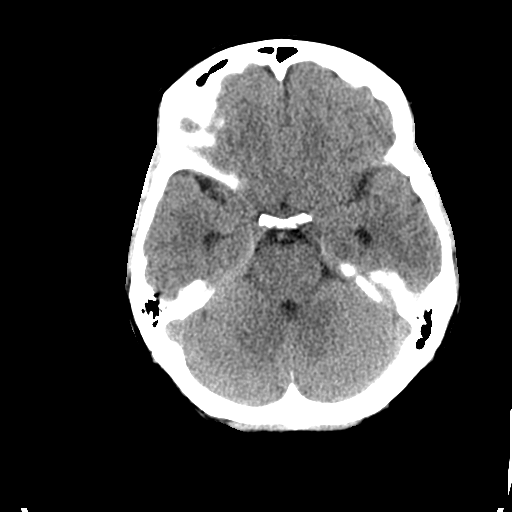
[im 11/31  brain]
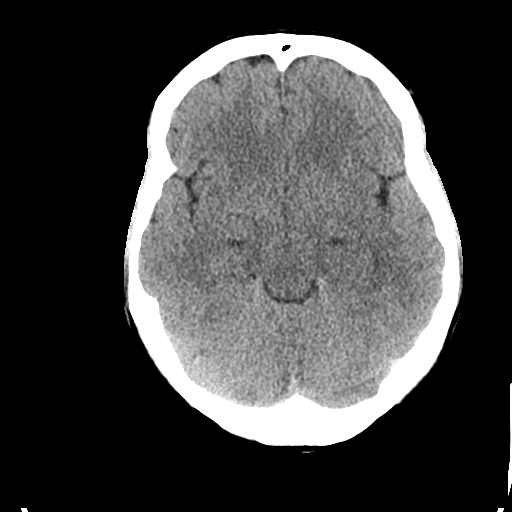
[im 11/31  bone]
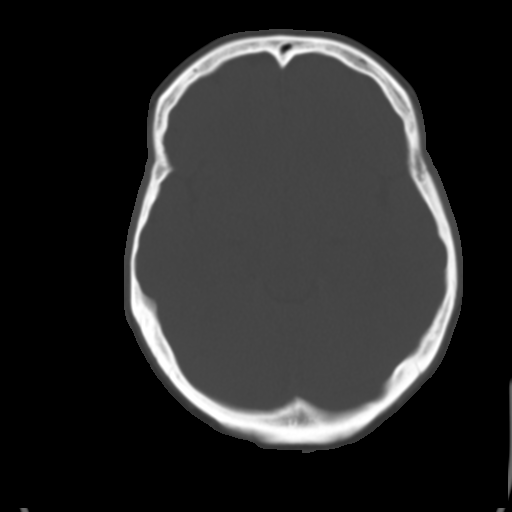
[im 13/31  brain]
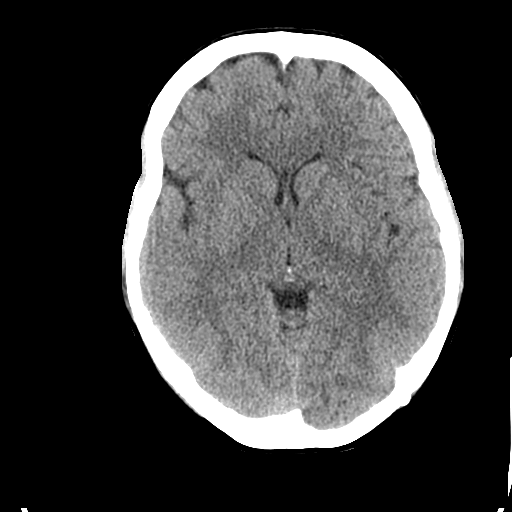
[im 16/31  brain]
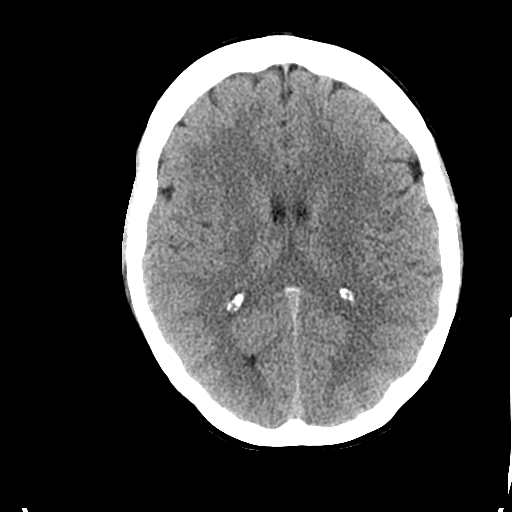
[im 18/31  brain]
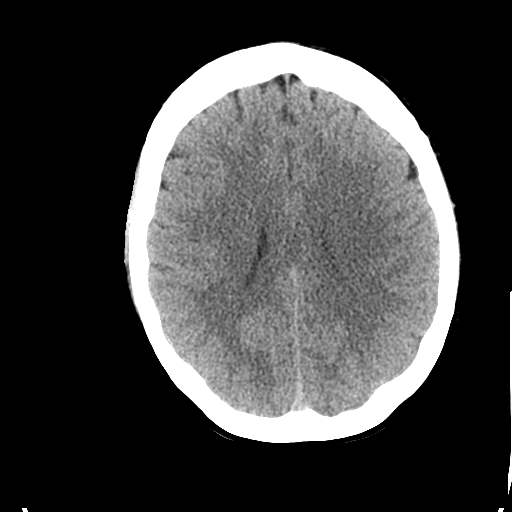
[im 20/31  brain]
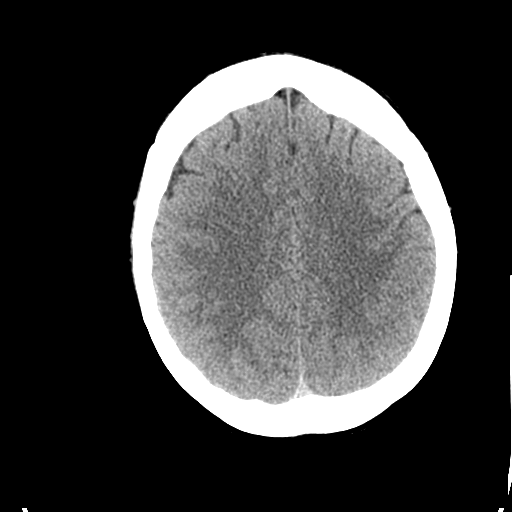
[im 20/31  bone]
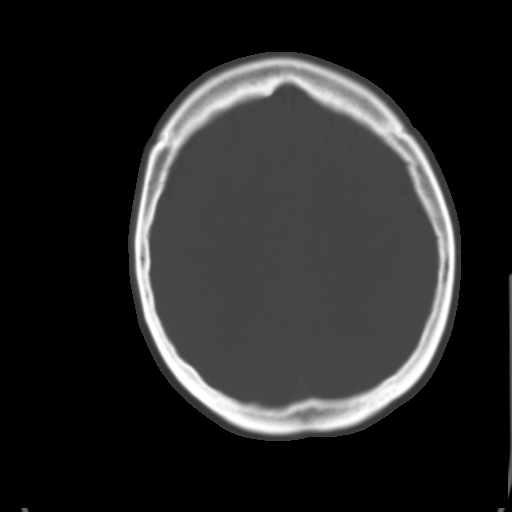
[im 22/31  brain]
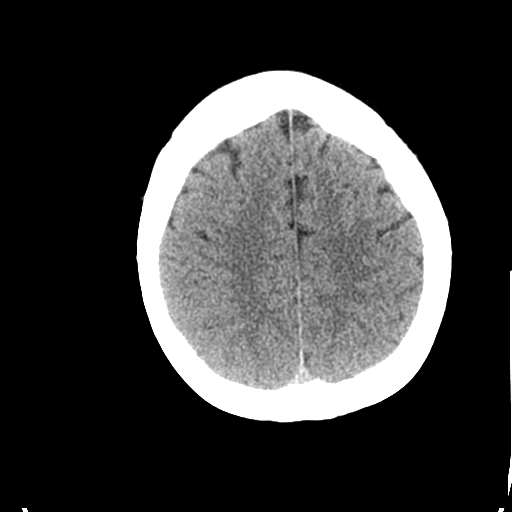
[im 24/31  brain]
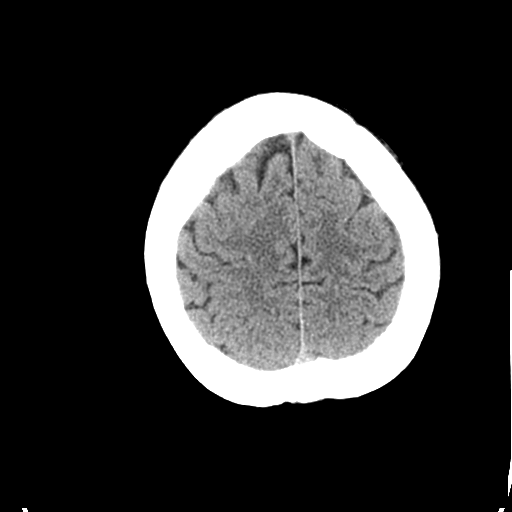
[im 26/31  brain]
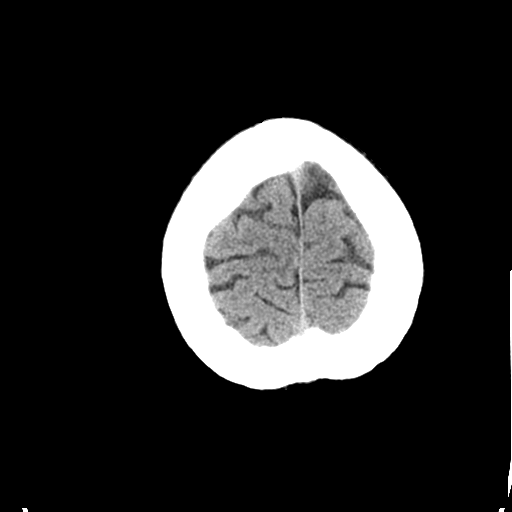
[im 28/31  brain]
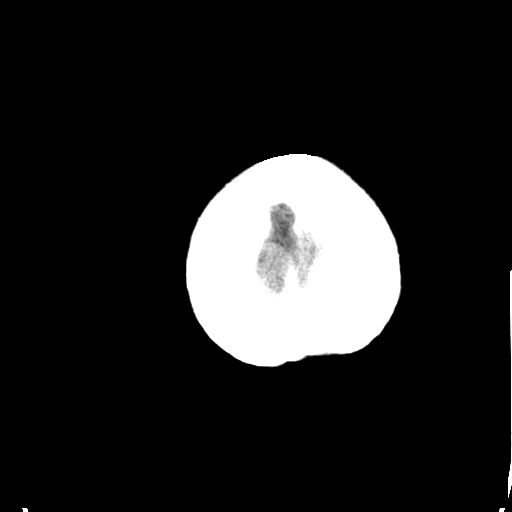
[im 28/31  bone]
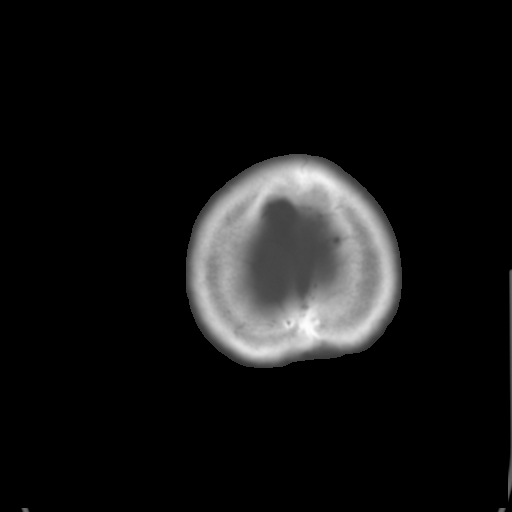

[Series 3: bone windows · axial · 0.45mm/px · z∈[+204,+244]mm · 3 of 31 slices shown]
[im 3/31  bone]
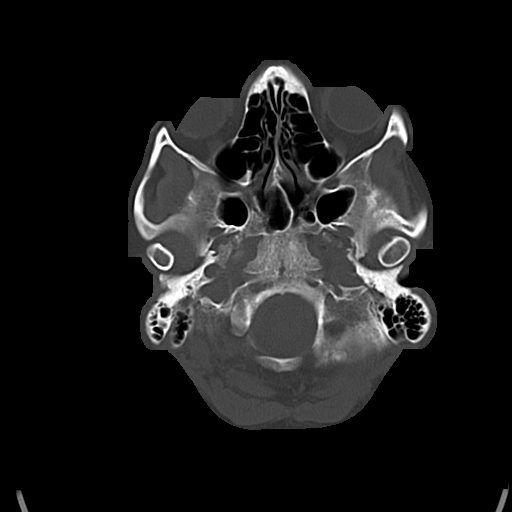
[im 7/31  bone]
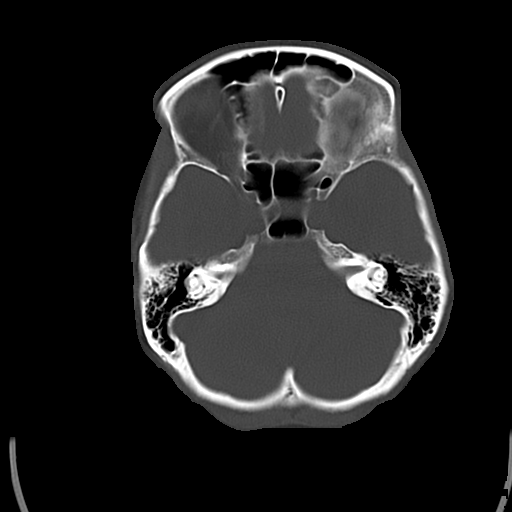
[im 11/31  bone]
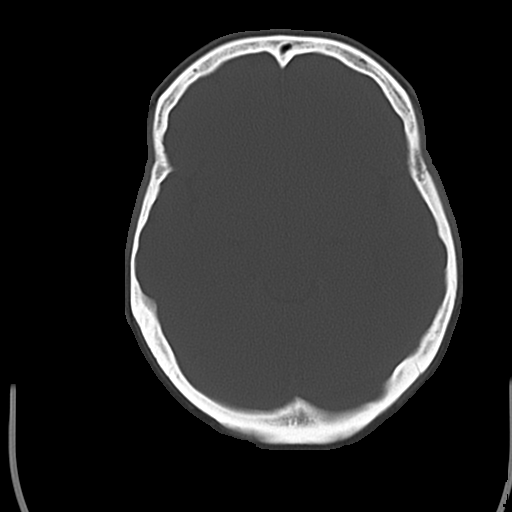

[16 of 30 positions shown; findings below may reference images not displayed]

FINDINGS: Ventricles are normal in size and configuration. All areas of the
brain demonstrate normal gray-white matter attenuation. There is no
mass, hemorrhage, edema, or other evidence of acute parenchymal
abnormality. No extra-axial hemorrhage. No osseous abnormality.
Visualized upper paranasal sinuses are clear.
IMPRESSION: Normal head CT.
